# Patient Record
Sex: Female | Born: 1955
Health system: Southern US, Community
[De-identification: ages and names within clinical notes are randomized; demographics above are authoritative.]

## PROBLEM LIST (undated history)

## (undated) DIAGNOSIS — E669 Obesity, unspecified: Secondary | ICD-10-CM

## (undated) DIAGNOSIS — I350 Nonrheumatic aortic (valve) stenosis: Secondary | ICD-10-CM

## (undated) DIAGNOSIS — I10 Essential (primary) hypertension: Secondary | ICD-10-CM

## (undated) DIAGNOSIS — E785 Hyperlipidemia, unspecified: Secondary | ICD-10-CM

## (undated) DIAGNOSIS — M1990 Unspecified osteoarthritis, unspecified site: Secondary | ICD-10-CM

## (undated) DIAGNOSIS — J069 Acute upper respiratory infection, unspecified: Secondary | ICD-10-CM

## (undated) DIAGNOSIS — M519 Unspecified thoracic, thoracolumbar and lumbosacral intervertebral disc disorder: Secondary | ICD-10-CM

## (undated) DIAGNOSIS — I480 Paroxysmal atrial fibrillation: Secondary | ICD-10-CM

## (undated) DIAGNOSIS — E215 Disorder of parathyroid gland, unspecified: Secondary | ICD-10-CM

## (undated) HISTORY — DX: Obesity, unspecified: E66.9

## (undated) HISTORY — PX: KNEE SURGERY: SHX244

## (undated) HISTORY — DX: Essential (primary) hypertension: I10

## (undated) HISTORY — PX: CARPAL TUNNEL RELEASE: SHX101

## (undated) HISTORY — DX: Hyperlipidemia, unspecified: E78.5

## (undated) HISTORY — DX: Unspecified osteoarthritis, unspecified site: M19.90

## (undated) HISTORY — DX: Nonrheumatic aortic (valve) stenosis: I35.0

## (undated) HISTORY — DX: Paroxysmal atrial fibrillation: I48.0

## (undated) HISTORY — DX: Unspecified thoracic, thoracolumbar and lumbosacral intervertebral disc disorder: M51.9

## (undated) HISTORY — PX: ABDOMINAL HYSTERECTOMY: SHX81

---

## 1987-11-01 HISTORY — PX: ABDOMINAL HYSTERECTOMY: SHX81

## 1998-04-14 ENCOUNTER — Emergency Department (HOSPITAL_COMMUNITY): Admission: EM | Admit: 1998-04-14 | Discharge: 1998-04-14 | Payer: Self-pay | Admitting: Emergency Medicine

## 2001-02-19 ENCOUNTER — Encounter: Payer: Self-pay | Admitting: Emergency Medicine

## 2001-02-19 ENCOUNTER — Emergency Department (HOSPITAL_COMMUNITY): Admission: EM | Admit: 2001-02-19 | Discharge: 2001-02-19 | Payer: Self-pay | Admitting: Emergency Medicine

## 2001-05-02 ENCOUNTER — Encounter: Payer: Self-pay | Admitting: Family Medicine

## 2001-05-02 ENCOUNTER — Ambulatory Visit (HOSPITAL_COMMUNITY): Admission: RE | Admit: 2001-05-02 | Discharge: 2001-05-02 | Payer: Self-pay | Admitting: Family Medicine

## 2001-12-09 ENCOUNTER — Emergency Department (HOSPITAL_COMMUNITY): Admission: EM | Admit: 2001-12-09 | Discharge: 2001-12-09 | Payer: Self-pay | Admitting: *Deleted

## 2003-03-19 ENCOUNTER — Ambulatory Visit (HOSPITAL_COMMUNITY): Admission: RE | Admit: 2003-03-19 | Discharge: 2003-03-19 | Payer: Self-pay | Admitting: Orthopaedic Surgery

## 2003-03-19 ENCOUNTER — Encounter: Payer: Self-pay | Admitting: Orthopaedic Surgery

## 2003-11-01 HISTORY — PX: CARPAL TUNNEL RELEASE: SHX101

## 2006-04-12 ENCOUNTER — Ambulatory Visit (HOSPITAL_BASED_OUTPATIENT_CLINIC_OR_DEPARTMENT_OTHER): Admission: RE | Admit: 2006-04-12 | Discharge: 2006-04-12 | Payer: Self-pay | Admitting: *Deleted

## 2006-04-26 ENCOUNTER — Ambulatory Visit (HOSPITAL_BASED_OUTPATIENT_CLINIC_OR_DEPARTMENT_OTHER): Admission: RE | Admit: 2006-04-26 | Discharge: 2006-04-26 | Payer: Self-pay | Admitting: *Deleted

## 2006-10-31 HISTORY — PX: KNEE SURGERY: SHX244

## 2009-04-27 ENCOUNTER — Ambulatory Visit: Payer: Self-pay | Admitting: Orthopedic Surgery

## 2009-04-27 DIAGNOSIS — IMO0002 Reserved for concepts with insufficient information to code with codable children: Secondary | ICD-10-CM | POA: Insufficient documentation

## 2009-04-27 DIAGNOSIS — M1710 Unilateral primary osteoarthritis, unspecified knee: Secondary | ICD-10-CM

## 2009-04-30 ENCOUNTER — Encounter (INDEPENDENT_AMBULATORY_CARE_PROVIDER_SITE_OTHER): Payer: Self-pay | Admitting: *Deleted

## 2009-05-05 ENCOUNTER — Telehealth (INDEPENDENT_AMBULATORY_CARE_PROVIDER_SITE_OTHER): Payer: Self-pay | Admitting: *Deleted

## 2009-10-14 ENCOUNTER — Encounter: Payer: Self-pay | Admitting: Cardiology

## 2009-11-20 ENCOUNTER — Encounter: Payer: Self-pay | Admitting: Cardiology

## 2009-11-30 ENCOUNTER — Ambulatory Visit: Payer: Self-pay | Admitting: Cardiology

## 2009-11-30 DIAGNOSIS — E785 Hyperlipidemia, unspecified: Secondary | ICD-10-CM | POA: Insufficient documentation

## 2009-11-30 DIAGNOSIS — R0609 Other forms of dyspnea: Secondary | ICD-10-CM | POA: Insufficient documentation

## 2009-11-30 DIAGNOSIS — I10 Essential (primary) hypertension: Secondary | ICD-10-CM | POA: Insufficient documentation

## 2009-11-30 DIAGNOSIS — R0989 Other specified symptoms and signs involving the circulatory and respiratory systems: Secondary | ICD-10-CM

## 2009-12-01 ENCOUNTER — Encounter: Payer: Self-pay | Admitting: Cardiology

## 2009-12-01 ENCOUNTER — Ambulatory Visit: Payer: Self-pay | Admitting: Internal Medicine

## 2009-12-01 ENCOUNTER — Ambulatory Visit (HOSPITAL_COMMUNITY): Admission: RE | Admit: 2009-12-01 | Discharge: 2009-12-01 | Payer: Self-pay | Admitting: Cardiology

## 2009-12-01 ENCOUNTER — Ambulatory Visit: Payer: Self-pay

## 2009-12-02 ENCOUNTER — Telehealth: Payer: Self-pay | Admitting: Cardiology

## 2009-12-08 ENCOUNTER — Inpatient Hospital Stay (HOSPITAL_COMMUNITY): Admission: RE | Admit: 2009-12-08 | Discharge: 2009-12-10 | Payer: Self-pay | Admitting: Orthopedic Surgery

## 2010-02-23 ENCOUNTER — Inpatient Hospital Stay (HOSPITAL_COMMUNITY): Admission: AD | Admit: 2010-02-23 | Discharge: 2010-02-24 | Payer: Self-pay | Admitting: Orthopedic Surgery

## 2010-09-11 ENCOUNTER — Emergency Department (HOSPITAL_COMMUNITY): Admission: EM | Admit: 2010-09-11 | Discharge: 2010-09-11 | Payer: Self-pay | Admitting: Emergency Medicine

## 2010-11-30 NOTE — Progress Notes (Signed)
Summary: B/P follow-up  Phone Note Outgoing Call   Call placed by: Katina Dung, RN, BSN,  December 02, 2009 12:24 PM Call placed to: Patient Summary of Call: follow-up B/P  Follow-up for Phone Call        I called pt about her B/P --she had not  rechecked it since she saw Dr Shirlee Latch 11-30-09--she states she is seeing Dr Charlann Boxer today and she will call me back and let me know what her B/P is this afternoon

## 2010-11-30 NOTE — Assessment & Plan Note (Signed)
Summary: np6/surgical clearance/total knee   Visit Type:  Initial Consult Primary Provider:  Mirna Mires, MD  CC:  Surgical clearance- Total left knee replacement.  History of Present Illness: 55 yo with history of Garner, Melissa Garner, Melissa osteoarthritis presents for cardiac evaluation prior to left total knee replacement.  Patient has no significant cardiac history.  She has never had chest pain.  Ambulation is limited by knee pain.  She can walk about 1/4 mile before becoming short of breath.  She is able to climb a flight of steps but is short of breath by the time she gets to the top.  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  No chest pain.  She is noted to be mildly bradycardic (HR 48 on ECG) but denies lightheadedness or syncope.  Her knee surgery is scheduled for next Tuesday.  Labs (12/10): K 3.9, creatinine 0.64, LDL 138, HDL 40  ECG: sinus bradycardia at 48, 1st degree AVB (PR 220 msec).   Current Medications (verified): 1)  Amlodipine Besylate 10 Mg Tabs (Amlodipine Besylate) .... One Tablet Daily 2)  Lisinopril-Hydrochlorothiazide 20-12.5 Mg Tabs (Lisinopril-Hydrochlorothiazide) .... Take 1 Tablet By Mouth Once A Day 3)  Ibuprofen 200 Mg Tabs (Ibuprofen) .... As Needed 4)  Tylenol Extra Strength 500 Mg Tabs (Acetaminophen) .... As Needed 5)  Tramadol-Acetaminophen 37.5-325 Mg Tabs (Tramadol-Acetaminophen) .... As Needed 6)  Pravachol 40 Mg Tabs (Pravastatin Sodium) .... One Tablet Daily in The Evening  Allergies (verified): No Known Drug Allergies  Past History:  Past Medical History: 1. HTN 2. Garner 3. Hyperlipema 4. Osteoarthritis 5. Asthma  Family History: Family History of Arthritis Hx, family, asthma Father with MI at 40 Mother with MI at 75  Social History: Patient is divorced, lives in [place].  CNA Nonsmoker No ETOH  Review of Systems       All systems reviewed Melissa negative except as per HPI.    Vital Signs:  Patient profile:   55 year old female Height:      65 inches Weight:      272.50 pounds BMI:     45.51 Pulse rate:   56 / minute Pulse rhythm:   irregular Resp:     18 per minute BP sitting:   155 / 87  (left arm) Cuff size:   large  Vitals Entered By: Vikki Ports (November 30, 2009 3:56 PM)  Physical Exam  General:  Well developed, well nourished, in no acute distress. Obese.  Head:  normocephalic Melissa atraumatic Nose:  no deformity, discharge, inflammation, or lesions Mouth:  Teeth, gums Melissa palate normal. Oral mucosa normal. Neck:  Neck supple, no JVD. No masses, thyromegaly or abnormal cervical nodes. Lungs:  Clear bilaterally to auscultation Melissa percussion. Heart:  Non-displaced PMI, chest non-tender; regular rate Melissa rhythm, S1, S2 without murmurs, rubs or gallops. Carotid upstroke normal, no bruit.  Pedals normal pulses. No edema, no varicosities. Abdomen:  Bowel sounds positive; abdomen soft Melissa non-tender without masses, organomegaly, or hernias noted. No hepatosplenomegaly. Extremities:  No clubbing or cyanosis. Neurologic:  Alert Melissa oriented x 3. Skin:  Intact without lesions or rashes. Psych:  Normal affect.   Impression & Recommendations:  Problem # 1:  PREOPERATIVE CARDIAC EVALUATION No history of chest pain.  She does have some exertional shortness of breath which could certainly be due to Garner Melissa deconditioning.  She is able to exert herself to > 4 METS.  I think that she  is in a reasonable risk category to undergo surgery.  Stress testing would be unlikelly to be useful in this situation.   Problem # 2:  DYSPNEA ON EXERTION (ICD-786.09) This is likely due to Garner Melissa deconditioning.  I will have her get an echocardiogram to make sure that LV systolic function is normal.   Problem # 3:  HYPERTENSION, UNSPECIFIED (ICD-401.9) SBP is 155 here Melissa has been running in the 150s to 160s when she has checked it recently.  I will have her  increase her amlodipine to 10 mg daily. Followup BP check in 2 weeks.   Problem # 4:  Melissa Garner-MIXED (ICD-272.4) Given strong family history of CAD (mother at age 55), will start pravastatin 40 mg daily (LDL 138).  Lipids/LFTs in 2 months.    Other Orders: Echocardiogram (Echo)  Patient Instructions: 1)  Your physician has recommended you make the following change in your medication:  2)  Increase Amlodipine to 10mg  daily 3)  Start Pravachol 40mg  one daily in the evening 4)  AFTER  your surgery on Tuesday start Aspirin 81mg  daily 5)  Check your blood pressure--I will call you in a couple of days to get the readings 6)  Your physician has requested that you have an echocardiogram.  Echocardiography is a painless test that uses sound waves to create images of your heart. It provides your doctor with information about the size Melissa shape of your heart Melissa how well your heart's chambers Melissa valves are working.  This procedure takes approximately one hour. There are no restrictions for this procedure. THIS WEEK FOR SURGICAL CLEARANCE 7)  Your physician recommends that you return for a FASTING lipid profile/liver profile in 2 months   Prescriptions: PRAVACHOL 40 MG TABS (PRAVASTATIN SODIUM) one tablet daily in the evening  #30 x 3   Entered by:   Katina Dung, RN, BSN   Authorized by:   Marca Ancona, MD   Signed by:   Katina Dung, RN, BSN on 11/30/2009   Method used:   Electronically to        [facility]. [phone]* (retail)       [address] [place], [place]  [address]       Ph: [phone]       Fax: [phone]   RxID:   [id number] AMLODIPINE BESYLATE 10 MG TABS (AMLODIPINE BESYLATE) one tablet daily  #30 x 6   Entered by:   Katina Dung, RN, BSN   Authorized by:   Marca Ancona, MD   Signed by:   Katina Dung, RN, BSN on 11/30/2009   Method used:   Electronically to        [facility]. [phone]* (retail)       [address]  [place]  [address]       Ph: [phone]       Fax: [phone]   RxID:   [id number]

## 2010-11-30 NOTE — Consult Note (Signed)
Summary: [facility]  [facility]   Imported By: Marylou Mccoy 12/03/2009 13:53:53  _____________________________________________________________________  External Attachment:    Type:   Image     Comment:   External Document

## 2011-01-11 LAB — URINALYSIS, ROUTINE W REFLEX MICROSCOPIC
Glucose, UA: NEGATIVE mg/dL
Hgb urine dipstick: NEGATIVE
Leukocytes, UA: NEGATIVE
Nitrite: NEGATIVE
Protein, ur: 30 mg/dL — AB
Specific Gravity, Urine: >1.03 — ABNORMAL HIGH (ref 1.005–1.030)
Urobilinogen, UA: 2 mg/dL — ABNORMAL HIGH (ref 0.0–1.0)
pH: 6 (ref 5.0–8.0)

## 2011-01-11 LAB — URINE CULTURE

## 2011-01-11 LAB — URINE MICROSCOPIC-ADD ON

## 2011-01-18 LAB — BASIC METABOLIC PANEL
Calcium: 10.5 mg/dL (ref 8.4–10.5)
GFR calc Af Amer: >60 mL/min (ref >60)
GFR calc non Af Amer: >60 mL/min (ref >60)
Potassium: 3.8 mEq/L (ref 3.5–5.1)
Sodium: 142 mEq/L (ref 135–145)

## 2011-01-20 LAB — DIFFERENTIAL
Eosinophils Relative: 2 % (ref 0–5)
Lymphocytes Relative: 37 % (ref 12–46)
Lymphs Abs: 1.5 10*3/uL (ref 0.7–4.0)
Monocytes Relative: 9 % (ref 3–12)

## 2011-01-20 LAB — URINALYSIS, ROUTINE W REFLEX MICROSCOPIC
Glucose, UA: NEGATIVE mg/dL
Protein, ur: NEGATIVE mg/dL
Specific Gravity, Urine: 1.022 (ref 1.005–1.030)
Urobilinogen, UA: 0.2 mg/dL (ref 0.0–1.0)

## 2011-01-20 LAB — CBC
HCT: 37.9 % (ref 36.0–46.0)
Hemoglobin: 12.6 g/dL (ref 12.0–15.0)
MCHC: 33.2 g/dL (ref 30.0–36.0)
MCHC: 33.9 g/dL (ref 30.0–36.0)
MCV: 82.7 fL (ref 78.0–100.0)
Platelets: 152 10*3/uL (ref 150–400)
Platelets: 173 10*3/uL (ref 150–400)
RBC: 3.21 MIL/uL — ABNORMAL LOW (ref 3.87–5.11)
RBC: 4.59 MIL/uL (ref 3.87–5.11)
WBC: 4 10*3/uL (ref 4.0–10.5)
WBC: 6.2 10*3/uL (ref 4.0–10.5)

## 2011-01-20 LAB — BASIC METABOLIC PANEL
BUN: 12 mg/dL (ref 6–23)
BUN: 14 mg/dL (ref 6–23)
CO2: 27 mEq/L (ref 19–32)
Calcium: 9.2 mg/dL (ref 8.4–10.5)
Chloride: 102 mEq/L (ref 96–112)
Chloride: 105 mEq/L (ref 96–112)
Creatinine, Ser: 0.53 mg/dL (ref 0.4–1.2)
Creatinine, Ser: 0.66 mg/dL (ref 0.4–1.2)
GFR calc Af Amer: >60 mL/min (ref >60)
GFR calc Af Amer: >60 mL/min (ref >60)
GFR calc non Af Amer: >60 mL/min (ref >60)
Potassium: 3.6 mEq/L (ref 3.5–5.1)
Sodium: 138 mEq/L (ref 135–145)

## 2011-01-20 LAB — TYPE AND SCREEN: ABO/RH(D): O NEG

## 2011-01-20 LAB — ABO/RH: ABO/RH(D): O NEG

## 2011-01-20 LAB — PROTIME-INR: Prothrombin Time: 13.3 seconds (ref 11.6–15.2)

## 2011-03-18 NOTE — Op Note (Signed)
NAME:  Melissa Garner, Melissa Garner           ACCOUNT NO.:  [id number]   MEDICAL RECORD NO.:  [id number]          PATIENT TYPE:  AMB   LOCATION:  DSC                          FACILITY:  [facility]   PHYSICIAN:  Tennis Must Meyerdierks, M.D.DATE OF BIRTH:  1956/07/26   DATE OF PROCEDURE:  04/12/2006  DATE OF DISCHARGE:                                 OPERATIVE REPORT   PREOPERATIVE DIAGNOSIS:  Left carpal tunnel syndrome.   POSTOP DIAGNOSIS:  Left carpal tunnel syndrome.   PROCEDURE:  Decompression median nerve left carpal tunnel.   SURGEON:  Lowell Bouton, M.D.   ANESTHESIA:  Half percent Marcaine local with sedation.   OPERATIVE FINDINGS:  The patient had no masses in the carpal canal.  The  motor branch of the nerve was intact.   PROCEDURE:  Under half percent Marcaine local anesthesia with a tourniquet  on the left arm.  The left hand was prepped and draped in usual fashion and  after exsanguinating the limb, tourniquet was inflated to 250 mmHg.  3 cm  longitudinal incision was made in the palm just ulnar to the thenar crease.  Sharp dissection was carried through the subcutaneous tissues.  Blunt  dissection was carried through the superficial fascia distal to the  transverse carpal ligament.  A hemostat was then placed in the carpal canal  up against the hook of the hamate and the transverse carpal ligament was  divided on the ulnar border of median nerve.  The proximal end of ligament  was divided with the scissors after dissecting the nerve away from the  undersurface of the ligament.  The nerve was then examined and the motor  branch was identified.  The carpal canal was palpated and was found to be  adequately decompressed.  The wound was irrigated with saline.  The skin was  closed with 4-0 nylon sutures.  Sterile dressings were applied followed by a  volar wrist splint.  The patient tolerated the procedure well and went to  recovery room awake, stable and in good  condition.      Lowell Bouton, M.D.  Electronically Signed     EMM/MEDQ  D:  04/12/2006  T:  04/12/2006  Job:  [id number]

## 2011-03-18 NOTE — Op Note (Signed)
NAME:  Melissa Garner, Melissa Garner           ACCOUNT NO.:  [id number]   MEDICAL RECORD NO.:  [id number]          PATIENT TYPE:  AMB   LOCATION:  DSC                          FACILITY:  [facility]   PHYSICIAN:  Tennis Must Meyerdierks, M.D.DATE OF BIRTH:  [birth year]   DATE OF PROCEDURE:  04/26/2006  DATE OF DISCHARGE:                                 OPERATIVE REPORT   PREOPERATIVE DIAGNOSIS:  Right carpal tunnel syndrome.   POSTOPERATIVE DIAGNOSIS:  Right carpal tunnel syndrome.   PROCEDURE:  Decompression of median nerve right carpal tunnel.   SURGEON:  Lowell Bouton, M.D.   ANESTHESIA:  0.5% Marcaine local with sedation.   OPERATIVE FINDINGS:  The patient had no masses in the carpal canal.  The  motor branch of the nerve was intact.   PROCEDURE:  Under 0.5% Marcaine local anesthesia with a tourniquet on the  right arm, the right hand was prepped and draped in the usual fashion. After  exsanguinating the limb, the tourniquet was inflated to 250 mmHg.  A 3 cm  longitudinal incision was made in the palm just ulnar to the thenar crease.  Sharp dissection was carried through the subcutaneous tissues.  Blunt  dissection was carried through the superficial palmar fascia distal to the  transverse carpal ligament.  A hemostat was then placed in the carpal canal  up against the hook of the hamate and the transverse carpal ligament was  divided on the ulnar border of the median nerve sharply.  The proximal end  of the ligament was divided with the scissors after dissecting the nerve  away from the under surface of the ligament.  The carpal canal was then  palpated and was found to be adequately decompressed. The nerve was examined  and the motor branch was identified.  The wound was then irrigated with  saline.  The skin was closed with 4-0 nylon sutures.  Sterile dressings were  applied followed by a volar wrist splint.  The patient tolerated the  procedure well and went to the recovery  room awake in stable, good  condition.      Lowell Bouton, M.D.  Electronically Signed     EMM/MEDQ  D:  04/26/2006  T:  04/26/2006  Job:  [id number]

## 2013-01-12 ENCOUNTER — Encounter (HOSPITAL_COMMUNITY): Payer: Self-pay

## 2013-01-12 ENCOUNTER — Emergency Department (HOSPITAL_COMMUNITY)
Admission: EM | Admit: 2013-01-12 | Discharge: 2013-01-12 | Disposition: A | Discharge status: Home / Self Care | Payer: BC Managed Care – PPO | Attending: Emergency Medicine | Admitting: Emergency Medicine

## 2013-01-12 ENCOUNTER — Emergency Department (HOSPITAL_COMMUNITY): Payer: BC Managed Care – PPO

## 2013-01-12 DIAGNOSIS — I10 Essential (primary) hypertension: Secondary | ICD-10-CM | POA: Insufficient documentation

## 2013-01-12 DIAGNOSIS — R109 Unspecified abdominal pain: Secondary | ICD-10-CM

## 2013-01-12 DIAGNOSIS — N2889 Other specified disorders of kidney and ureter: Secondary | ICD-10-CM

## 2013-01-12 DIAGNOSIS — Z90710 Acquired absence of both cervix and uterus: Secondary | ICD-10-CM | POA: Insufficient documentation

## 2013-01-12 LAB — CBC WITH DIFFERENTIAL/PLATELET
Eosinophils Relative: 2 % (ref 0–5)
HCT: 38.1 % (ref 36.0–46.0)
Lymphocytes Relative: 35 % (ref 12–46)
Lymphs Abs: 1.4 10*3/uL (ref 0.7–4.0)
MCV: 81.4 fL (ref 78.0–100.0)
Platelets: 224 10*3/uL (ref 150–400)
RBC: 4.68 MIL/uL (ref 3.87–5.11)
WBC: 4.2 10*3/uL (ref 4.0–10.5)

## 2013-01-12 LAB — URINALYSIS, ROUTINE W REFLEX MICROSCOPIC
Ketones, ur: NEGATIVE mg/dL
Nitrite: NEGATIVE
Protein, ur: NEGATIVE mg/dL
Specific Gravity, Urine: >1.03 — ABNORMAL HIGH (ref 1.005–1.030)
Urobilinogen, UA: 0.2 mg/dL (ref 0.0–1.0)

## 2013-01-12 LAB — COMPREHENSIVE METABOLIC PANEL
ALT: 10 U/L (ref 0–35)
Alkaline Phosphatase: 103 U/L (ref 39–117)
CO2: 28 mEq/L (ref 19–32)
Calcium: 10.7 mg/dL — ABNORMAL HIGH (ref 8.4–10.5)
Chloride: 104 mEq/L (ref 96–112)
GFR calc Af Amer: >90 mL/min (ref >90)
GFR calc non Af Amer: >90 mL/min (ref >90)
Glucose, Bld: 97 mg/dL (ref 70–99)
Sodium: 142 mEq/L (ref 135–145)
Total Bilirubin: 0.4 mg/dL (ref 0.3–1.2)

## 2013-01-12 MED ORDER — HYDROCODONE-ACETAMINOPHEN 5-325 MG PO TABS: 2.0000 | ORAL_TABLET | ORAL | Status: DC | PRN

## 2013-01-12 NOTE — ED Provider Notes (Signed)
History     CSN: [id number]  Arrival date & time 01/12/13  1310   First MD Initiated Contact with Patient 01/12/13 1338      Chief Complaint  Patient presents with  . Flank Pain    (Consider location/radiation/quality/duration/timing/severity/associated sxs/prior treatment) HPI Comments: Patient presents with pain in the left flank for the past two days.  She denies any fevers or chills.  No urinary complaints.  No history of kidney stones.    Patient is a 57 y.o. female presenting with flank pain. The history is provided by the patient.  Flank Pain This is a new problem. The current episode started 2 days ago. The problem occurs constantly. The problem has been gradually worsening. Pertinent negatives include no abdominal pain. Nothing aggravates the symptoms. Nothing relieves the symptoms. She has tried nothing for the symptoms.    Past Medical History  Diagnosis Date  . Hypertension     Past Surgical History  Procedure Laterality Date  . Abdominal hysterectomy      partial  . Knee surgery      No family history on file.  History  Substance Use Topics  . Smoking status: Never Smoker   . Smokeless tobacco: Not on file  . Alcohol Use: No    OB History   Grav Para Term Preterm Abortions TAB SAB Ect Mult Living                  Review of Systems  Gastrointestinal: Negative for abdominal pain.  Genitourinary: Positive for flank pain.  All other systems reviewed and are negative.    Allergies  Review of patient's allergies indicates no known allergies.  Home Medications  No current outpatient prescriptions on file.  BP 160/92  Pulse 60  Temp(Src) 98 F (36.7 C) (Oral)  Resp 18  Ht 5\' 6"  (1.676 m)  Wt 286 lb (129.729 kg)  BMI 46.18 kg/m2  SpO2 98%  Physical Exam  Nursing note and vitals reviewed. Constitutional: She is oriented to person, place, and time. She appears well-developed and well-nourished. No distress.  HENT:  Head: Normocephalic and  atraumatic.  Mouth/Throat: Oropharynx is clear and moist.  Neck: Normal range of motion. Neck supple.  Cardiovascular: Normal rate and regular rhythm.   No murmur heard. Pulmonary/Chest: Effort normal and breath sounds normal. No respiratory distress. She has no wheezes.  Abdominal: Soft. Bowel sounds are normal. She exhibits no distension. There is no tenderness.  There is mild cva ttp.    Musculoskeletal: Normal range of motion. She exhibits no edema.  Lymphadenopathy:    She has no cervical adenopathy.  Neurological: She is alert and oriented to person, place, and time.  Skin: Skin is warm and dry. She is not diaphoretic.    ED Course  Procedures (including critical care time)  Labs Reviewed  COMPREHENSIVE METABOLIC PANEL - Abnormal; Notable for the following:    Calcium 10.7 (*)    All other components within normal limits  URINALYSIS, ROUTINE W REFLEX MICROSCOPIC - Abnormal; Notable for the following:    Specific Gravity, Urine >1.030 (*)    Bilirubin Urine SMALL (*)    All other components within normal limits  CBC WITH DIFFERENTIAL   Ct Abdomen Pelvis Wo Contrast  01/12/2013  *RADIOLOGY REPORT*  Clinical Data: 57 year old female with abdominal, pelvic and flank pain.  CT ABDOMEN AND PELVIS WITHOUT CONTRAST  Technique:  Multidetector CT imaging of the abdomen and pelvis was performed following the standard protocol without intravenous  contrast.  Comparison: Report from 08/10/2004 pelvic ultrasound.  Findings: The lung bases are clear.  The liver, spleen, pancreas, right kidney and gallbladder are unremarkable.  An 8 mm focal bulge of the posterior mid left kidney (image 30) is noted and a small focal exophytic mass is not excluded.  Please note that parenchymal abnormalities may be missed as intravenous contrast was not administered.  No free fluid, enlarged lymph nodes, biliary dilation or abdominal aortic aneurysm identified.  The bowel, bladder and appendix are unremarkable. The  patient is status post hysterectomy.  A moderate to large right paramedian low pelvic ventral hernia containing fat is identified. A 1.3 x 1.5 cm density in the left adnexal region is noted, but has a similar size to the previously reported probable left ovary on the 08/10/2004 ultrasound.  No acute or suspicious bony abnormalities are identified.  IMPRESSION: No evidence of acute abnormality.  Possible 8 mm left renal mass.  Recommend CT or MRI with and without contrast for further evaluation.  Moderate to large right paramedian low pelvic ventral hernia containing fat.   Original Report Authenticated By: Harmon Pier, M.D.      No diagnosis found.    MDM  The labs and ua are essentially unremarkable.  The ct does not reveal a kidney stone, but there is the suggestion of a possible renal mass.  The suggestion is for a ct with contrast or mri.  I will make arrangements for an outpatient mri, results to be followed up by Dr. Minerva Areola.         Geoffery Lyons, MD 01/12/13 [phone]

## 2013-01-12 NOTE — ED Notes (Signed)
Pt reports pain that starts in her left side that radiates to her back. Denies N/V/D and denies painful urination.

## 2013-01-15 ENCOUNTER — Ambulatory Visit (HOSPITAL_COMMUNITY)
Admit: 2013-01-15 | Discharge: 2013-01-15 | Disposition: A | Discharge status: Home / Self Care | Payer: BC Managed Care – PPO | Source: Ambulatory Visit | Attending: Emergency Medicine | Admitting: Emergency Medicine

## 2013-01-15 ENCOUNTER — Encounter (HOSPITAL_COMMUNITY): Payer: Self-pay

## 2013-01-15 DIAGNOSIS — N2889 Other specified disorders of kidney and ureter: Secondary | ICD-10-CM

## 2013-01-15 DIAGNOSIS — R9389 Abnormal findings on diagnostic imaging of other specified body structures: Secondary | ICD-10-CM | POA: Insufficient documentation

## 2013-01-15 DIAGNOSIS — R1032 Left lower quadrant pain: Secondary | ICD-10-CM | POA: Insufficient documentation

## 2013-01-15 MED ORDER — GADOBENATE DIMEGLUMINE 529 MG/ML IV SOLN
20.0000 mL | Freq: Once | INTRAVENOUS | Status: AC | PRN
  Administered 2013-01-15: 20 mL via INTRAVENOUS

## 2013-07-11 ENCOUNTER — Emergency Department (HOSPITAL_COMMUNITY)
Admission: EM | Admit: 2013-07-11 | Discharge: 2013-07-12 | Disposition: A | Discharge status: Home / Self Care | Payer: BC Managed Care – PPO | Attending: Emergency Medicine | Admitting: Emergency Medicine

## 2013-07-11 ENCOUNTER — Encounter (HOSPITAL_COMMUNITY): Payer: Self-pay | Admitting: Emergency Medicine

## 2013-07-11 DIAGNOSIS — R001 Bradycardia, unspecified: Secondary | ICD-10-CM

## 2013-07-11 DIAGNOSIS — Z79899 Other long term (current) drug therapy: Secondary | ICD-10-CM | POA: Insufficient documentation

## 2013-07-11 DIAGNOSIS — R110 Nausea: Secondary | ICD-10-CM

## 2013-07-11 DIAGNOSIS — I498 Other specified cardiac arrhythmias: Secondary | ICD-10-CM | POA: Insufficient documentation

## 2013-07-11 DIAGNOSIS — I10 Essential (primary) hypertension: Secondary | ICD-10-CM | POA: Insufficient documentation

## 2013-07-11 MED ORDER — SODIUM CHLORIDE 0.9 % IV BOLUS (SEPSIS)
1000.0000 mL | Freq: Once | INTRAVENOUS | Status: AC
  Administered 2013-07-11: 1000 mL via INTRAVENOUS

## 2013-07-11 MED ORDER — ONDANSETRON HCL 4 MG/2ML IJ SOLN
4.0000 mg | Freq: Once | INTRAMUSCULAR | Status: AC
  Administered 2013-07-11: 4 mg via INTRAVENOUS
  Filled 2013-07-11: qty 2

## 2013-07-11 NOTE — ED Provider Notes (Signed)
CSN: [id number]     Arrival date & time 07/11/13  2247 History   This chart was scribed for Lyanne Co, MD by Clydene Laming, ED Scribe. This patient was seen in room APA18/APA18 and the patient's care was started at 11:25 PM. Chief Complaint  Patient presents with  . Dizziness  . Nausea    The history is provided by the patient. No language interpreter was used.    HPI Comments: Melissa Garner is a 57 y.o. female who presents to the Emergency Department complaining of dizziness that began a couple of hours ago that is worsening with associated nausea and a feeling of being light headed . She denies having symptoms at rest. Pt has prior episodes of bradycardia with a rate normally in the forties and fifty's. She has f/u [facility] and was told she has mild CHF. Pt denies any recent illness,chest pain, SOB, frequency, abdominal pain, diarrhea, emesis, fever, chills, leg swelling, and hematochezia. Pt denies any prior thyroid complications. Pt takes hydrochlorothiazide-lisinopril, amlodipine, and acetaminophen. Pt denies any change in medications.Pcp is Dr. Loleta Chance.    Past Medical History  Diagnosis Date  . Hypertension    Past Surgical History  Procedure Laterality Date  . Abdominal hysterectomy      partial  . Knee surgery     No family history on file. History  Substance Use Topics  . Smoking status: Never Smoker   . Smokeless tobacco: Not on file  . Alcohol Use: No   OB History   Grav Para Term Preterm Abortions TAB SAB Ect Mult Living                 Review of Systems  A complete 10 system review of systems was obtained and all systems are negative except as noted in the HPI and PMH.   Allergies  Review of patient's allergies indicates no known allergies.  Home Medications   Current Outpatient Rx  Name  Route  Sig  Dispense  Refill  . amLODipine (NORVASC) 10 MG tablet   Oral   Take 0.5 tablets (5 mg total) by mouth daily.   30 tablet   0   .  HYDROcodone-acetaminophen (NORCO) 5-325 MG per tablet   Oral   Take 2 tablets by mouth every 4 (four) hours as needed for pain.   20 tablet   0   . lisinopril-hydrochlorothiazide (PRINZIDE,ZESTORETIC) 20-12.5 MG per tablet   Oral   Take 1 tablet by mouth daily.   30 tablet   1   . ondansetron (ZOFRAN ODT) 8 MG disintegrating tablet   Oral   Take 1 tablet (8 mg total) by mouth every 8 (eight) hours as needed for nausea.   10 tablet   0    Triage Vitals: BP 146/81  Pulse 42  Temp(Src) 98.4 F (36.9 C) (Oral)  Resp 20  Ht 5\' 5"  (1.651 m)  Wt 286 lb (129.729 kg)  BMI 47.59 kg/m2  SpO2 100% Physical Exam  Nursing note and vitals reviewed. Constitutional: She is oriented to person, place, and time. She appears well-developed and well-nourished. No distress.  HENT:  Head: Normocephalic and atraumatic.  Eyes: EOM are normal.  Neck: Normal range of motion.  Cardiovascular: Regular rhythm and normal heart sounds.  Bradycardia present.   No murmur heard. Pulmonary/Chest: Effort normal and breath sounds normal.  Lungs normal  Abdominal: Soft. She exhibits no distension. There is no tenderness.  Musculoskeletal: Normal range of motion.  Neurological: She is  alert and oriented to person, place, and time.  Skin: Skin is warm and dry.  Psychiatric: She has a normal mood and affect. Judgment normal.      ED Course  Procedures (including critical care time) DIAGNOSTIC STUDIES: Oxygen Saturation is 100% on RA, normal by my interpretation.     Date: 07/11/2013  Rate: 40  Rhythm: sinus bradycardia  QRS Axis: normal  Intervals: PR prolonged  ST/T Wave abnormalities: normal  Conduction Disutrbances:first-degree A-V block   Narrative Interpretation:   Old EKG Reviewed: none available  COORDINATION OF CARE:  11:33 PM- Reviewed patient's prior records in room. Advised patient that her amlodipine may need to be changed. Discussed treatment plan with pt at bedside. Pt verbalized  understanding and agreement with plan.    Labs Review Labs Reviewed  BASIC METABOLIC PANEL - Abnormal; Notable for the following:    Glucose, Bld 112 (*)    All other components within normal limits  CBC  TROPONIN I  MAGNESIUM  TSH  T4, FREE   Imaging Review No results found.  MDM   1. Nausea   2. Bradycardia    Patient is overall well-appearing.  She's had no anginal symptoms.  Her EKG does demonstrate sinus bradycardia.  She's been bradycardic before in the past.  Her last cardiology visit her heart rate was 48 with first degree AV block.  She is on amlodipine which acts as a calcium channel blocker.  The patient's amlodipine will be reduced from 10 mg daily to 5 mg daily.  She was able to and around the emergency apartment pain difficulty.  She feels fine.  Discharge home with cardiology followup.  She has not had syncope.  A 1 point she did describe her symptoms as the room to be spinning around and therefore some of this could represent peripheral vertigo.  Home with Zofran.  Doubt central vertigo.  Discharge home in good condition.  I personally performed the services described in this documentation, which was scribed in my presence. The recorded information has been reviewed and is accurate.      Lyanne Co, MD 07/12/13 [phone]

## 2013-07-11 NOTE — ED Notes (Signed)
Patient states she has been dizzy and nauseated all day, but has been getting worse in the past few hours.

## 2013-07-11 NOTE — ED Notes (Signed)
MD at bedside. 

## 2013-07-12 LAB — CBC
MCV: 84.4 fL (ref 78.0–100.0)
Platelets: 206 10*3/uL (ref 150–400)
RBC: 4.54 MIL/uL (ref 3.87–5.11)
RDW: 13.6 % (ref 11.5–15.5)
WBC: 4.7 10*3/uL (ref 4.0–10.5)

## 2013-07-12 LAB — BASIC METABOLIC PANEL
Chloride: 104 mEq/L (ref 96–112)
Creatinine, Ser: 0.72 mg/dL (ref 0.50–1.10)
GFR calc Af Amer: >90 mL/min (ref >90)
Sodium: 139 mEq/L (ref 135–145)

## 2013-07-12 LAB — MAGNESIUM: Magnesium: 1.9 mg/dL (ref 1.5–2.5)

## 2013-07-12 LAB — TROPONIN I: Troponin I: <0.3 ng/mL (ref <0.30)

## 2013-07-12 LAB — T4, FREE: Free T4: 0.94 ng/dL (ref 0.80–1.80)

## 2013-07-12 MED ORDER — AMLODIPINE BESYLATE 10 MG PO TABS: 5.0000 mg | ORAL_TABLET | Freq: Every day | ORAL | Status: DC

## 2013-07-12 MED ORDER — ONDANSETRON 8 MG PO TBDP: 8.0000 mg | ORAL_TABLET | Freq: Three times a day (TID) | ORAL | Status: DC | PRN

## 2013-07-12 MED ORDER — LISINOPRIL-HYDROCHLOROTHIAZIDE 20-12.5 MG PO TABS: 1.0000 | ORAL_TABLET | Freq: Every day | ORAL | Status: DC

## 2015-01-14 ENCOUNTER — Other Ambulatory Visit (HOSPITAL_COMMUNITY): Payer: Self-pay | Admitting: Family Medicine

## 2015-01-14 ENCOUNTER — Ambulatory Visit (HOSPITAL_COMMUNITY)
Admission: RE | Admit: 2015-01-14 | Discharge: 2015-01-14 | Disposition: A | Discharge status: Home / Self Care | Payer: BLUE CROSS/BLUE SHIELD | Source: Ambulatory Visit | Attending: Family Medicine | Admitting: Family Medicine

## 2015-01-14 DIAGNOSIS — G8929 Other chronic pain: Secondary | ICD-10-CM

## 2015-01-14 DIAGNOSIS — M25511 Pain in right shoulder: Secondary | ICD-10-CM | POA: Diagnosis not present

## 2015-01-19 ENCOUNTER — Other Ambulatory Visit (HOSPITAL_COMMUNITY): Payer: Self-pay | Admitting: Family Medicine

## 2015-01-19 DIAGNOSIS — Z1231 Encounter for screening mammogram for malignant neoplasm of breast: Secondary | ICD-10-CM

## 2015-01-28 ENCOUNTER — Ambulatory Visit (HOSPITAL_COMMUNITY): Payer: BLUE CROSS/BLUE SHIELD

## 2015-02-02 ENCOUNTER — Ambulatory Visit (HOSPITAL_COMMUNITY)
Admission: RE | Admit: 2015-02-02 | Discharge: 2015-02-02 | Disposition: A | Discharge status: Home / Self Care | Payer: BLUE CROSS/BLUE SHIELD | Source: Ambulatory Visit | Attending: Family Medicine | Admitting: Family Medicine

## 2015-02-02 DIAGNOSIS — Z1231 Encounter for screening mammogram for malignant neoplasm of breast: Secondary | ICD-10-CM | POA: Insufficient documentation

## 2015-02-24 ENCOUNTER — Other Ambulatory Visit: Payer: Self-pay | Admitting: Family Medicine

## 2015-02-24 DIAGNOSIS — R928 Other abnormal and inconclusive findings on diagnostic imaging of breast: Secondary | ICD-10-CM

## 2015-03-02 ENCOUNTER — Other Ambulatory Visit: Payer: BLUE CROSS/BLUE SHIELD

## 2015-03-06 ENCOUNTER — Ambulatory Visit
Admission: RE | Admit: 2015-03-06 | Discharge: 2015-03-06 | Disposition: A | Discharge status: Home / Self Care | Payer: BLUE CROSS/BLUE SHIELD | Source: Ambulatory Visit | Attending: Family Medicine | Admitting: Family Medicine

## 2015-03-06 ENCOUNTER — Encounter (INDEPENDENT_AMBULATORY_CARE_PROVIDER_SITE_OTHER): Payer: Self-pay

## 2015-03-06 DIAGNOSIS — R928 Other abnormal and inconclusive findings on diagnostic imaging of breast: Secondary | ICD-10-CM

## 2015-12-10 ENCOUNTER — Other Ambulatory Visit (HOSPITAL_COMMUNITY): Payer: Self-pay | Admitting: Family Medicine

## 2015-12-10 DIAGNOSIS — N289 Disorder of kidney and ureter, unspecified: Secondary | ICD-10-CM

## 2015-12-17 ENCOUNTER — Ambulatory Visit: Payer: BLUE CROSS/BLUE SHIELD | Admitting: Orthopaedic Surgery

## 2015-12-17 ENCOUNTER — Telehealth: Payer: Self-pay

## 2015-12-17 NOTE — Telephone Encounter (Signed)
Pt states that she has constipation all of the time. OV with Laban Emperor, NP on 12/30/2015 at 8:30 AM.

## 2015-12-17 NOTE — Telephone Encounter (Signed)
Pt received a letter from DS. I told her DS does her triages in the afternoons and would be in touch with her. Please call [phone]

## 2015-12-21 ENCOUNTER — Other Ambulatory Visit (HOSPITAL_COMMUNITY): Payer: Self-pay | Admitting: Family Medicine

## 2015-12-21 DIAGNOSIS — M545 Low back pain: Secondary | ICD-10-CM

## 2015-12-21 DIAGNOSIS — N289 Disorder of kidney and ureter, unspecified: Secondary | ICD-10-CM

## 2015-12-23 ENCOUNTER — Ambulatory Visit (HOSPITAL_COMMUNITY)
Admission: RE | Admit: 2015-12-23 | Discharge: 2015-12-23 | Disposition: A | Discharge status: Home / Self Care | Payer: BLUE CROSS/BLUE SHIELD | Source: Ambulatory Visit | Attending: Family Medicine | Admitting: Family Medicine

## 2015-12-23 ENCOUNTER — Other Ambulatory Visit: Payer: Self-pay | Admitting: Family Medicine

## 2015-12-23 DIAGNOSIS — N289 Disorder of kidney and ureter, unspecified: Secondary | ICD-10-CM

## 2015-12-23 DIAGNOSIS — M545 Low back pain: Secondary | ICD-10-CM

## 2015-12-23 MED ORDER — SODIUM CHLORIDE 0.9 % IV SOLN
INTRAVENOUS | Status: AC
  Filled 2015-12-23: qty 250

## 2015-12-23 MED ORDER — SODIUM CHLORIDE 0.9% FLUSH
INTRAVENOUS | Status: AC
  Filled 2015-12-23: qty 100

## 2015-12-30 ENCOUNTER — Encounter: Payer: Self-pay | Admitting: Gastroenterology

## 2015-12-30 ENCOUNTER — Ambulatory Visit (INDEPENDENT_AMBULATORY_CARE_PROVIDER_SITE_OTHER): Payer: BLUE CROSS/BLUE SHIELD | Admitting: Gastroenterology

## 2015-12-30 ENCOUNTER — Other Ambulatory Visit: Payer: Self-pay

## 2015-12-30 VITALS — BP 159/79 | HR 68 | Temp 98.0°F | Ht 66.0 in | Wt 289.2 lb

## 2015-12-30 DIAGNOSIS — Z1211 Encounter for screening for malignant neoplasm of colon: Secondary | ICD-10-CM

## 2015-12-30 DIAGNOSIS — K5900 Constipation, unspecified: Secondary | ICD-10-CM | POA: Diagnosis not present

## 2015-12-30 MED ORDER — PEG 3350-KCL-NA BICARB-NACL 420 G PO SOLR: 4000.0000 mL | ORAL | Status: DC

## 2015-12-30 NOTE — Assessment & Plan Note (Signed)
Chronic constipation. Start Linzess 145 mcg once daily. Samples provided and voucher card. If she likes this, we will send in a 90 day supply so she may receive this at a reduced cost.

## 2015-12-30 NOTE — Progress Notes (Signed)
cc'ed to pcp °

## 2015-12-30 NOTE — Assessment & Plan Note (Signed)
60 year old female due for initial screening colonoscopy now, without any concerning signs or symptoms. No FH of colon cancer.  Proceed with colonoscopy with Dr. Oneida Alar in the near future. The risks, benefits, and alternatives have been discussed in detail with the patient. They state understanding and desire to proceed.

## 2015-12-30 NOTE — Patient Instructions (Signed)
We have scheduled you for a colonoscopy with Dr. Oneida Alar in the near future. This is for routine screening purposes.  For constipation: start taking Linzess 1 capsule each morning on an empty stomach, at least 30 minutes before breakfast. You may have some loose stool for a few days, but it should get better. If not, call me. I have provided a voucher for you to activate only if you like it and would like an actual prescription.

## 2015-12-30 NOTE — Progress Notes (Signed)
Primary Care Physician:  Maggie Font, MD Primary Gastroenterologist:  Dr. Oneida Alar   Chief Complaint  Patient presents with  . Constipation  . set up TCS    HPI:   Melissa Garner is a 60 y.o. female presenting today at the request of Dr. Berdine Addison to arrange a routine screening colonoscopy. She was brought in due to history of chronic constipation.   Chronic constipation for last several years. Every now and then will have to take Miralax or something of that nature. Associated abdominal cramping, bloating. Now having a BM once every 3-4 days. Bristol scale #1. No rectal bleeding. No unexplained weight loss or lack of appetite. No family history of colon cancer. No prior colonoscopy. 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 Nursing Assistant. Has MRI scheduled due to concern for renal cyst.   Past Medical History  Diagnosis Date  . Hypertension   . Joint pain     Past Surgical History  Procedure Laterality Date  . Abdominal hysterectomy      partial  . Knee surgery    . Carpal tunnel release      Current Outpatient Prescriptions  Medication Sig Dispense Refill  . amLODipine (NORVASC) 10 MG tablet Take 0.5 tablets (5 mg total) by mouth daily. 30 tablet 0  . furosemide (LASIX) 20 MG tablet Take 20 mg by mouth. 1 tab on Monday and friday    . ibuprofen (ADVIL,MOTRIN) 200 MG tablet Take 200 mg by mouth every 6 (six) hours as needed.    Marland Kitchen lisinopril-hydrochlorothiazide (PRINZIDE,ZESTORETIC) 20-12.5 MG per tablet Take 1 tablet by mouth daily. 30 tablet 1  . traMADol (ULTRAM) 50 MG tablet   0   No current facility-administered medications for this visit.    Allergies as of 12/30/2015  . (No Known Allergies)    Family History  Problem Relation Age of Onset  . Colon cancer Neg Hx     Social History   Social History  . Marital Status: Single    Spouse Name: N/A  . Number of Children: N/A  . Years of Education: N/A   Occupational History   . nursing assistant    Social History Main Topics  . Smoking status: Never Smoker   . Smokeless tobacco: Not on file  . Alcohol Use: No  . Drug Use: No  . Sexual Activity: Not on file   Other Topics Concern  . Not on file   Social History Narrative    Review of Systems: Gen: see HPI  CV: occasional irregular heart beat, notes "murmur since I was a child".  Resp: +SOB GI: see HPI  GU : Denies urinary burning, urinary frequency, urinary hesitancy MS: pain in back, knee pain  Derm: Denies rash, itching, dry skin Psych: Denies depression, anxiety, memory loss, and confusion Heme: see HPI   Physical Exam: BP 159/79 mmHg  Pulse 68  Temp(Src) 98 F (36.7 C)  Ht 5\' 6"  (1.676 m)  Wt 289 lb 3.2 oz (131.18 kg)  BMI 46.70 kg/m2 General:   Alert and oriented. Pleasant and cooperative. Well-nourished and well-developed.  Head:  Normocephalic and atraumatic. Eyes:  Without icterus, sclera clear and conjunctiva pink.  Ears:  Normal auditory acuity. Nose:  No deformity, discharge,  or lesions. Mouth:  No deformity or lesions, oral mucosa pink.  Lungs:  Clear to auscultation bilaterally. No wheezes, rales, or rhonchi. No distress.  Heart:  S1, S2 present with 2/6 systolic murmur  Abdomen:  +BS, soft, obese, non-tender and non-distended. No HSM noted. No guarding or rebound. No masses appreciated.  Rectal:  Deferred  Msk:  Symmetrical without gross deformities. Normal posture. Extremities:  With trace ankle and pedal edema  Neurologic:  Alert and  oriented x4;  grossly normal neurologically. Psych:  Alert and cooperative. Normal mood and affect.

## 2016-01-04 ENCOUNTER — Ambulatory Visit: Admission: RE | Admit: 2016-01-04 | Payer: BLUE CROSS/BLUE SHIELD | Source: Ambulatory Visit

## 2016-01-04 ENCOUNTER — Ambulatory Visit
Admission: RE | Admit: 2016-01-04 | Discharge: 2016-01-04 | Disposition: A | Discharge status: Home / Self Care | Payer: BLUE CROSS/BLUE SHIELD | Source: Ambulatory Visit | Attending: Family Medicine | Admitting: Family Medicine

## 2016-01-04 ENCOUNTER — Other Ambulatory Visit: Payer: Self-pay | Admitting: Family Medicine

## 2016-01-04 DIAGNOSIS — N289 Disorder of kidney and ureter, unspecified: Secondary | ICD-10-CM

## 2016-01-04 DIAGNOSIS — M545 Low back pain: Secondary | ICD-10-CM

## 2016-01-04 MED ORDER — GADOBENATE DIMEGLUMINE 529 MG/ML IV SOLN
20.0000 mL | Freq: Once | INTRAVENOUS | Status: AC | PRN
  Administered 2016-01-04: 20 mL via INTRAVENOUS

## 2016-01-14 ENCOUNTER — Ambulatory Visit (HOSPITAL_COMMUNITY)
Admission: RE | Admit: 2016-01-14 | Discharge: 2016-01-14 | Disposition: A | Discharge status: Home / Self Care | Payer: BLUE CROSS/BLUE SHIELD | Source: Ambulatory Visit | Attending: Gastroenterology | Admitting: Gastroenterology

## 2016-01-14 ENCOUNTER — Encounter (HOSPITAL_COMMUNITY): Payer: Self-pay | Admitting: *Deleted

## 2016-01-14 ENCOUNTER — Encounter (HOSPITAL_COMMUNITY)
Admission: RE | Disposition: A | Discharge status: Home / Self Care | Payer: Self-pay | Source: Ambulatory Visit | Attending: Gastroenterology

## 2016-01-14 DIAGNOSIS — I10 Essential (primary) hypertension: Secondary | ICD-10-CM | POA: Diagnosis not present

## 2016-01-14 DIAGNOSIS — Z79899 Other long term (current) drug therapy: Secondary | ICD-10-CM | POA: Diagnosis not present

## 2016-01-14 DIAGNOSIS — Q438 Other specified congenital malformations of intestine: Secondary | ICD-10-CM | POA: Diagnosis not present

## 2016-01-14 DIAGNOSIS — Z1211 Encounter for screening for malignant neoplasm of colon: Secondary | ICD-10-CM | POA: Insufficient documentation

## 2016-01-14 DIAGNOSIS — K644 Residual hemorrhoidal skin tags: Secondary | ICD-10-CM | POA: Diagnosis not present

## 2016-01-14 HISTORY — PX: COLONOSCOPY: SHX5424

## 2016-01-14 SURGERY — COLONOSCOPY
Anesthesia: Moderate Sedation

## 2016-01-14 MED ORDER — MEPERIDINE HCL 100 MG/ML IJ SOLN
INTRAMUSCULAR | Status: AC
  Filled 2016-01-14: qty 2

## 2016-01-14 MED ORDER — SODIUM CHLORIDE 0.9 % IV SOLN
INTRAVENOUS | Status: DC
  Administered 2016-01-14: 14:00:00 via INTRAVENOUS

## 2016-01-14 MED ORDER — STERILE WATER FOR IRRIGATION IR SOLN
Status: DC | PRN
  Administered 2016-01-14: 2.5 mL

## 2016-01-14 MED ORDER — MIDAZOLAM HCL 5 MG/5ML IJ SOLN
INTRAMUSCULAR | Status: DC | PRN
Start: 2016-01-14 — End: 2016-01-14
  Administered 2016-01-14: 2 mg via INTRAVENOUS
  Administered 2016-01-14: 1 mg via INTRAVENOUS
  Administered 2016-01-14 (×2): 2 mg via INTRAVENOUS

## 2016-01-14 MED ORDER — MIDAZOLAM HCL 5 MG/5ML IJ SOLN
INTRAMUSCULAR | Status: AC
  Filled 2016-01-14: qty 10

## 2016-01-14 MED ORDER — MEPERIDINE HCL 100 MG/ML IJ SOLN
INTRAMUSCULAR | Status: DC | PRN
  Administered 2016-01-14 (×4): 25 mg via INTRAVENOUS

## 2016-01-14 NOTE — H&P (Signed)
  Primary Care Physician:  Maggie Font, MD Primary Gastroenterologist:  Dr. Oneida Alar  Pre-Procedure History & Physical: HPI:  Melissa Garner is a 60 y.o. female here for [place].  Past Medical History  Diagnosis Date  . Hypertension   . Joint pain     Past Surgical History  Procedure Laterality Date  . Abdominal hysterectomy      partial  . Knee surgery    . Carpal tunnel release      Prior to Admission medications   Medication Sig Start Date End Date Taking? Authorizing Provider  acetaminophen (TYLENOL) 500 MG tablet Take 500 mg by mouth every 6 (six) hours as needed for moderate pain.   Yes Historical Provider, MD  amLODipine (NORVASC) 10 MG tablet Take 0.5 tablets (5 mg total) by mouth daily. Patient taking differently: Take 10 mg by mouth daily.  07/12/13  Yes Jola Schmidt, MD  furosemide (LASIX) 20 MG tablet Take 20 mg by mouth 2 (two) times a week. 1 tab on Monday and friday   Yes Historical Provider, MD  ibuprofen (ADVIL,MOTRIN) 200 MG tablet Take 800 mg by mouth every 6 (six) hours as needed for moderate pain.    Yes Historical Provider, MD  lisinopril-hydrochlorothiazide (PRINZIDE,ZESTORETIC) 20-12.5 MG per tablet Take 1 tablet by mouth daily. 07/12/13  Yes Jola Schmidt, MD  polyethylene glycol-electrolytes (TRILYTE) 420 g solution Take 4,000 mLs by mouth as directed. 12/30/15  Yes Danie Binder, MD  traMADol (ULTRAM) 50 MG tablet Take 50 mg by mouth daily as needed for moderate pain.  12/23/15  Yes Historical Provider, MD    Allergies as of 12/30/2015  . (No Known Allergies)    Family History  Problem Relation Age of Onset  . Colon cancer Neg Hx     Social History   Social History  . Marital Status: Single    Spouse Name: N/A  . Number of Children: N/A  . Years of Education: N/A   Occupational History  . nursing assistant    Social History Main Topics  . Smoking status: Never Smoker   . Smokeless tobacco: Not on file  . Alcohol Use: No   . Drug Use: No  . Sexual Activity: Not on file   Other Topics Concern  . Not on file   Social History Narrative    Review of Systems: See HPI, otherwise negative ROS   Physical Exam: BP 144/68 mmHg  Pulse 56  Temp(Src) 98.3 F (36.8 C) (Oral)  Resp 12  SpO2 99% General:   Alert,  pleasant and cooperative in NAD Head:  Normocephalic and atraumatic. Neck:  Supple; Lungs:  Clear throughout to auscultation.    Heart:  Regular rate and rhythm. Abdomen:  Soft, nontender and nondistended. Normal bowel sounds, without guarding, and without rebound.   Neurologic:  Alert and  oriented x4;  grossly normal neurologically.  Impression/Plan:     SCREENING  Plan:  1. TCS TODAY

## 2016-01-14 NOTE — Discharge Instructions (Signed)
You have internal hemorrhoids. YOU DID NOT HAVE ANY POLYPS.   CONTINUE YOUR WEIGHT LOSS EFFORTS. Lose ten pounds. YOUR BODY MASS INDEX IS OVER 40 WHICH MEANS YOU ARE morbidly OBESE. OBESITY INCREASES YOUR RISK FOR ALL CANCERS, INCLUDING COLON CANCER AND SHORTENS YOUR LIFE EXPECTANCY 10 YEARS.  FOLLOW A HIGH FIBER DIET. AVOID ITEMS THAT CAUSE BLOATING. SEE INFO BELOW.  USE PREPARATION H UP TO 4 TIMES A DAY FOR RECTAL BLEEDING, ITCHING, PRESSURE, BURNING, OR PAIN.   Next colonoscopy in 10 years.  Colonoscopy Care After Read the instructions outlined below and refer to this sheet in the next week. These discharge instructions provide you with general information on caring for yourself after you leave the hospital. While your treatment has been planned according to the most current medical practices available, unavoidable complications occasionally occur. If you have any problems or questions after discharge, call DR. Haivyn Oravec, [phone].  ACTIVITY  You may resume your regular activity, but move at a slower pace for the next 24 hours.   Take frequent rest periods for the next 24 hours.   Walking will help get rid of the air and reduce the bloated feeling in your belly (abdomen).   No driving for 24 hours (because of the medicine (anesthesia) used during the test).   You may shower.   Do not sign any important legal documents or operate any machinery for 24 hours (because of the anesthesia used during the test).    NUTRITION  Drink plenty of fluids.   You may resume your normal diet as instructed by your doctor.   Begin with a light meal and progress to your normal diet. Heavy or fried foods are harder to digest and may make you feel sick to your stomach (nauseated).   Avoid alcoholic beverages for 24 hours or as instructed.    MEDICATIONS  You may resume your normal medications.   WHAT YOU CAN EXPECT TODAY  Some feelings of bloating in the abdomen.   Passage of more gas  than usual.   Spotting of blood in your stool or on the toilet paper  .  IF YOU HAD POLYPS REMOVED DURING THE COLONOSCOPY:  Eat a soft diet IF YOU HAVE NAUSEA, BLOATING, ABDOMINAL PAIN, OR VOMITING.    FINDING OUT THE RESULTS OF YOUR TEST Not all test results are available during your visit. DR. Oneida Alar WILL CALL YOU WITHIN 7 DAYS OF YOUR PROCEDUE WITH YOUR RESULTS. Do not assume everything is normal if you have not heard from DR. Seletha Zimmermann IN ONE WEEK, CALL HER OFFICE AT [phone].  SEEK IMMEDIATE MEDICAL ATTENTION AND CALL THE OFFICE: [phone] IF:  You have more than a spotting of blood in your stool.   Your belly is swollen (abdominal distention).   You are nauseated or vomiting.   You have a temperature over 101F.   You have abdominal pain or discomfort that is severe or gets worse throughout the day.  High-Fiber Diet A high-fiber diet changes your normal diet to include more whole grains, legumes, fruits, and vegetables. Changes in the diet involve replacing refined carbohydrates with unrefined foods. The calorie level of the diet is essentially unchanged. The Dietary Reference Intake (recommended amount) for adult males is 38 grams per day. For adult females, it is 25 grams per day. Pregnant and lactating women should consume 28 grams of fiber per day. Fiber is the intact part of a plant that is not broken down during digestion. Functional fiber is fiber that has  been isolated from the plant to provide a beneficial effect in the body. PURPOSE  Increase stool bulk.   Ease and regulate bowel movements.   Lower cholesterol.  REDUCE RISK OF COLON CANCER  INDICATIONS THAT YOU NEED MORE FIBER  Constipation and hemorrhoids.   Uncomplicated diverticulosis (intestine condition) and irritable bowel syndrome.   Weight management.   As a protective measure against hardening of the arteries (atherosclerosis), diabetes, and cancer.   GUIDELINES FOR INCREASING FIBER IN THE  DIET  Start adding fiber to the diet slowly. A gradual increase of about 5 more grams (2 slices of whole-wheat bread, 2 servings of most fruits or vegetables, or 1 bowl of high-fiber cereal) per day is best. Too rapid an increase in fiber may result in constipation, flatulence, and bloating.   Drink enough water and fluids to keep your urine clear or pale yellow. Water, juice, or caffeine-free drinks are recommended. Not drinking enough fluid may cause constipation.   Eat a variety of high-fiber foods rather than one type of fiber.   Try to increase your intake of fiber through using high-fiber foods rather than fiber pills or supplements that contain small amounts of fiber.   The goal is to change the types of food eaten. Do not supplement your present diet with high-fiber foods, but replace foods in your present diet.   INCLUDE A VARIETY OF FIBER SOURCES  Replace refined and processed grains with whole grains, canned fruits with fresh fruits, and incorporate other fiber sources. White rice, white breads, and most bakery goods contain little or no fiber.   Brown whole-grain rice, buckwheat oats, and many fruits and vegetables are all good sources of fiber. These include: broccoli, Brussels sprouts, cabbage, cauliflower, beets, sweet potatoes, white potatoes (skin on), carrots, tomatoes, eggplant, squash, berries, fresh fruits, and dried fruits.   Cereals appear to be the richest source of fiber. Cereal fiber is found in whole grains and bran. Bran is the fiber-rich outer coat of cereal grain, which is largely removed in refining. In whole-grain cereals, the bran remains. In breakfast cereals, the largest amount of fiber is found in those with "bran" in their names. The fiber content is sometimes indicated on the label.   You may need to include additional fruits and vegetables each day.   In baking, for 1 cup white flour, you may use the following substitutions:   1 cup whole-wheat flour  minus 2 tablespoons.   1/2 cup white flour plus 1/2 cup whole-wheat flour.   Hemorrhoids Hemorrhoids are dilated (enlarged) veins around the rectum. Sometimes clots will form in the veins. This makes them swollen and painful. These are called thrombosed hemorrhoids. Causes of hemorrhoids include:  Constipation.   Straining to have a bowel movement.   HEAVY LIFTING  HOME CARE INSTRUCTIONS  Eat a well balanced diet and drink 6 to 8 glasses of water every day to avoid constipation. You may also use a bulk laxative.   Avoid straining to have bowel movements.   Keep anal area dry and clean.   Do not use a donut shaped pillow or sit on the toilet for long periods. This increases blood pooling and pain.   Move your bowels when your body has the urge; this will require less straining and will decrease pain and pressure.

## 2016-01-14 NOTE — Op Note (Addendum)
[facility] Patient Name: Melissa Garner Procedure Date: 01/14/2016 3:01 PM MRN: [id number] Date of Birth: July 17, 1956 Attending MD: Barney Drain , MD CSN: [id number] Age: 60 Admit Type: Outpatient Procedure:                Colonoscopy Indications:              Screening for colorectal malignant neoplasm Providers:                Barney Drain, MD, Gwenlyn Fudge, RN, Zoila Shutter,                            Technologist Referring MD:             Barrie Folk. Hill, MD (Referring MD) Medicines:                Meperidine 100 mg IV, Midazolam 7 mg IV Complications:            No immediate complications. Estimated Blood Loss:     Estimated blood loss: none. Procedure:                Pre-Anesthesia Assessment:                           - Prior to the procedure, a History and Physical                            was performed, and patient medications and                            allergies were reviewed. The patient's tolerance of                            previous anesthesia was also reviewed. The risks                            and benefits of the procedure and the sedation                            options and risks were discussed with the patient.                            All questions were answered, and informed consent                            was obtained. Prior Anticoagulants: The patient has                            taken no previous anticoagulant or antiplatelet                            agents. ASA Grade Assessment: II - A patient with                            mild systemic disease. After reviewing the risks  and benefits, the patient was deemed in                            satisfactory condition to undergo the procedure.                           After obtaining informed consent, the colonoscope                            was passed under direct vision. Throughout the                            procedure, the patient's blood pressure,  pulse, and                            oxygen saturations were monitored continuously. The                            [id number] scope was introduced through                            the anus and advanced to the the cecum, identified                            by appendiceal orifice and ileocecal valve. The                            ileocecal valve, appendiceal orifice, and rectum                            were photographed. The colonoscopy was performed                            without difficulty. The patient tolerated the                            procedure well. The quality of the bowel                            preparation was excellent. Scope In: 3:25:06 PM Scope Out: 3:37:45 PM Scope Withdrawal Time: 0 hours 9 minutes 25 seconds  Total Procedure Duration: 0 hours 12 minutes 39 seconds  Findings:      The digital rectal exam findings include non-thrombosed external       hemorrhoids.      The recto-sigmoid colon was significantly redundant.      The colon (entire examined portion) appeared normal.      Non-bleeding external and internal hemorrhoids were found. The       hemorrhoids were mild. Impression:               - No specimens collected.                           - The entire examined colon is normal. Moderate Sedation:      Moderate (conscious) sedation was administered by the  endoscopy nurse       and supervised by the endoscopist. The following parameters were       monitored: oxygen saturation, heart rate, blood pressure, and response       to care. Total physician intraservice time was 26 minutes. Recommendation:           - Patient has a contact number available for                            emergencies. The signs and symptoms of potential                            delayed complications were discussed with the                            patient. Return to normal activities tomorrow.                            Written discharge instructions were  provided to the                            patient.                           - High fiber diet.                           - Medication reconciliation was performed, and a                            list of the patient's discharge medications was                            provided to the patient.                           - Repeat colonoscopy in 10 years for surveillance                            WITH AN OVERTUBE. Procedure Code(s):        --- Professional ---                           [phone], Colonoscopy, flexible; diagnostic, including                            collection of specimen(s) by brushing or washing,                            when performed (separate procedure)                           [id number], Moderate sedation services; each additional                            15 minutes intraservice time  [id number], Moderate sedation services provided by the                            same physician or other qualified health care                            professional performing a gastrointestinal                            endoscopic service that sedation supports,                            requiring the presence of an independent trained                            observer to assist in the monitoring of the                            patient's level of consciousness and physiological                            status; initial 15 minutes of intra-service time;                            patient age 67 years or older (additional time may                            be reported with [phone], as appropriate) Diagnosis Code(s):        --- Professional ---                           Z12.11, Encounter for screening for malignant                            neoplasm of colon CPT copyright 2016 [employer]. All rights reserved. The codes documented in this report are preliminary and upon coder review may  be revised to meet current compliance  requirements. Barney Drain, MD Barney Drain, MD 01/14/2016 4:32:55 PM This report has been signed electronically. Number of Addenda: 0

## 2016-01-19 ENCOUNTER — Encounter (HOSPITAL_COMMUNITY): Payer: Self-pay | Admitting: Gastroenterology

## 2016-01-19 ENCOUNTER — Ambulatory Visit: Payer: BLUE CROSS/BLUE SHIELD | Admitting: Orthopaedic Surgery

## 2016-07-13 ENCOUNTER — Emergency Department (HOSPITAL_COMMUNITY): Payer: 59

## 2016-07-13 ENCOUNTER — Encounter (HOSPITAL_COMMUNITY): Payer: Self-pay | Admitting: Emergency Medicine

## 2016-07-13 ENCOUNTER — Emergency Department (HOSPITAL_COMMUNITY)
Admission: EM | Admit: 2016-07-13 | Discharge: 2016-07-13 | Disposition: A | Discharge status: Home / Self Care | Payer: 59 | Attending: Emergency Medicine | Admitting: Emergency Medicine

## 2016-07-13 DIAGNOSIS — M549 Dorsalgia, unspecified: Secondary | ICD-10-CM

## 2016-07-13 DIAGNOSIS — Y999 Unspecified external cause status: Secondary | ICD-10-CM | POA: Insufficient documentation

## 2016-07-13 DIAGNOSIS — I10 Essential (primary) hypertension: Secondary | ICD-10-CM | POA: Insufficient documentation

## 2016-07-13 DIAGNOSIS — Z79899 Other long term (current) drug therapy: Secondary | ICD-10-CM | POA: Diagnosis not present

## 2016-07-13 DIAGNOSIS — Y9301 Activity, walking, marching and hiking: Secondary | ICD-10-CM | POA: Diagnosis not present

## 2016-07-13 DIAGNOSIS — W109XXA Fall (on) (from) unspecified stairs and steps, initial encounter: Secondary | ICD-10-CM | POA: Diagnosis not present

## 2016-07-13 DIAGNOSIS — Z791 Long term (current) use of non-steroidal anti-inflammatories (NSAID): Secondary | ICD-10-CM | POA: Diagnosis not present

## 2016-07-13 DIAGNOSIS — W19XXXA Unspecified fall, initial encounter: Secondary | ICD-10-CM

## 2016-07-13 DIAGNOSIS — S8012XA Contusion of left lower leg, initial encounter: Secondary | ICD-10-CM | POA: Insufficient documentation

## 2016-07-13 DIAGNOSIS — T148XXA Other injury of unspecified body region, initial encounter: Secondary | ICD-10-CM

## 2016-07-13 DIAGNOSIS — M545 Low back pain: Secondary | ICD-10-CM | POA: Insufficient documentation

## 2016-07-13 DIAGNOSIS — S5012XA Contusion of left forearm, initial encounter: Secondary | ICD-10-CM | POA: Insufficient documentation

## 2016-07-13 DIAGNOSIS — Y92009 Unspecified place in unspecified non-institutional (private) residence as the place of occurrence of the external cause: Secondary | ICD-10-CM | POA: Insufficient documentation

## 2016-07-13 DIAGNOSIS — S8992XA Unspecified injury of left lower leg, initial encounter: Secondary | ICD-10-CM | POA: Diagnosis present

## 2016-07-13 MED ORDER — HYDROCODONE-ACETAMINOPHEN 5-325 MG PO TABS
2.0000 | ORAL_TABLET | Freq: Once | ORAL | Status: AC
  Administered 2016-07-13: 2 via ORAL
  Filled 2016-07-13: qty 2

## 2016-07-13 MED ORDER — HYDROCODONE-ACETAMINOPHEN 5-325 MG PO TABS: 2.0000 | ORAL_TABLET | ORAL | 0 refills | Status: DC | PRN

## 2016-07-13 MED ORDER — IBUPROFEN 800 MG PO TABS: 800.0000 mg | ORAL_TABLET | Freq: Three times a day (TID) | ORAL | 0 refills | Status: DC

## 2016-07-13 NOTE — ED Triage Notes (Signed)
Pt reports slide down 10 steps this am. Pt reports right leg was caught in banister. No deformity noted, pt unable to bear weight. Pt reports right flank pain radiating to right leg/ankle. Pt reports left arm pain post fall as well. nad noted. Pt alert and oriented. Pt denies loc.

## 2016-07-13 NOTE — ED Notes (Signed)
Pain in right knee, right ankle, left lower leg, lt forearm and right posterior rib area. Bruising noted to forearm and right knee

## 2016-07-14 NOTE — ED Provider Notes (Signed)
[facility] DEPT Provider Note   CSN: [id number] Arrival date & time: 07/13/16  1127     History   Chief Complaint Chief Complaint  Patient presents with  . Fall    HPI Melissa Garner is a 60 y.o. female.  The history is provided by the patient. No language interpreter was used.  Fall  This is a new problem. The problem occurs constantly. The problem has been gradually worsening. Nothing aggravates the symptoms. Nothing relieves the symptoms. She has tried nothing for the symptoms.  Pt fell on steps.  Pt complains of pain to right knee and low back.  Pt has pain with bending right knee.  Difficulty walking.  Pt also has bruising to left arm and left lower leg  Past Medical History:  Diagnosis Date  . Hypertension   . Joint pain     Patient Active Problem List   Diagnosis Date Noted  . Special screening for malignant neoplasms, colon   . Encounter for screening colonoscopy 12/30/2015  . Constipation 12/30/2015  . HYPERLIPIDEMIA-MIXED 11/30/2009  . Unspecified essential hypertension 11/30/2009  . DYSPNEA ON EXERTION 11/30/2009  . MORBID OBESITY 04/30/2009  . KNEE, ARTHRITIS, DEGEN./OSTEO 04/27/2009    Past Surgical History:  Procedure Laterality Date  . ABDOMINAL HYSTERECTOMY     partial  . CARPAL TUNNEL RELEASE    . COLONOSCOPY N/A 01/14/2016   Procedure: COLONOSCOPY;  Surgeon: Danie Binder, MD;  Location: [facility];  Service: Endoscopy;  Laterality: N/A;  230   . KNEE SURGERY      OB History    No data available       Home Medications    Prior to Admission medications   Medication Sig Start Date End Date Taking? Authorizing Provider  acetaminophen (TYLENOL) 500 MG tablet Take 500 mg by mouth every 6 (six) hours as needed for moderate pain.    Historical Provider, MD  amLODipine (NORVASC) 10 MG tablet Take 0.5 tablets (5 mg total) by mouth daily. Patient taking differently: Take 10 mg by mouth daily.  07/12/13   Jola Schmidt, MD  furosemide  (LASIX) 20 MG tablet Take 20 mg by mouth 2 (two) times a week. 1 tab on Monday and friday    Historical Provider, MD  HYDROcodone-acetaminophen (NORCO/VICODIN) 5-325 MG tablet Take 2 tablets by mouth every 4 (four) hours as needed. 07/13/16   Fransico Meadow, PA-C  ibuprofen (ADVIL,MOTRIN) 800 MG tablet Take 1 tablet (800 mg total) by mouth 3 (three) times daily. 07/13/16   Fransico Meadow, PA-C  lisinopril-hydrochlorothiazide (PRINZIDE,ZESTORETIC) 20-12.5 MG per tablet Take 1 tablet by mouth daily. 07/12/13   Jola Schmidt, MD  polyethylene glycol-electrolytes (TRILYTE) 420 g solution Take 4,000 mLs by mouth as directed. 12/30/15   Danie Binder, MD  traMADol (ULTRAM) 50 MG tablet Take 50 mg by mouth daily as needed for moderate pain.  12/23/15   Historical Provider, MD    Family History Family History  Problem Relation Age of Onset  . Colon cancer Neg Hx     Social History Social History  Substance Use Topics  . Smoking status: Never Smoker  . Smokeless tobacco: Never Used  . Alcohol use No     Allergies   Review of patient's allergies indicates no known allergies.   Review of Systems Review of Systems  All other systems reviewed and are negative.    Physical Exam Updated Vital Signs BP 118/57   Pulse (!) 58   Temp 98.5 F (  36.9 C) (Oral)   Resp 18   Ht 5\' 6"  (1.676 m)   Wt 87.1 kg   SpO2 100%   BMI 30.99 kg/m   Physical Exam  Constitutional: She appears well-developed and well-nourished. No distress.  HENT:  Head: Normocephalic and atraumatic.  Eyes: Conjunctivae are normal.  Neck: Neck supple.  Cardiovascular: Normal rate and regular rhythm.   No murmur heard. Pulmonary/Chest: Effort normal and breath sounds normal. No respiratory distress.  Abdominal: Soft. There is no tenderness.  Musculoskeletal: She exhibits no edema.  Tender low back diffusely,  Tender right knee, pain with movement,  Bruising left forearm, nontender,  Bruising left lower leg,  nontender    Neurological: She is alert.  Skin: Skin is warm and dry.  Psychiatric: She has a normal mood and affect.  Nursing note and vitals reviewed.    ED Treatments / Results  Labs (all labs ordered are listed, but only abnormal results are displayed) Labs Reviewed - No data to display  EKG  EKG Interpretation None       Radiology Dg Lumbar Spine Complete  Result Date: 07/13/2016 CLINICAL DATA:  Fall, back pain EXAM: LUMBAR SPINE - COMPLETE 4+ VIEW COMPARISON:  None. FINDINGS: Five lumbar-type vertebral bodies. Normal lumbar lordosis. No evidence of fracture or dislocation. Vertebral body heights are maintained. Mild multilevel degenerative changes. Visualized bony pelvis appears intact IMPRESSION: No fracture or dislocation is seen. Electronically Signed   By: Julian Hy M.D.   On: 07/13/2016 12:59   Dg Knee Complete 4 Views Right  Result Date: 07/13/2016 CLINICAL DATA:  Slipped and fell at home this morning. Right knee pain. EXAM: RIGHT KNEE - COMPLETE 4+ VIEW COMPARISON:  08/22/2008. FINDINGS: Severe tricompartmental degenerative changes with joint space narrowing, osteophytic spurring, subchondral cystic change and bony eburnation. No definite acute fracture or osteochondral lesion. No definite joint effusion. Large superior patellar osteophyte could be remotely fractured. No discrete osteochondral lesion. IMPRESSION: Severe progressive tricompartmental degenerative changes involving the right knee without acute fracture or significant joint effusion. Electronically Signed   By: Marijo Sanes M.D.   On: 07/13/2016 13:21    Procedures Procedures (including critical care time)  Medications Ordered in ED Medications  HYDROcodone-acetaminophen (NORCO/VICODIN) 5-325 MG per tablet 2 tablet (2 tablets Oral Given 07/13/16 1405)     Initial Impression / Assessment and Plan / ED Course  I have reviewed the triage vital signs and the nursing notes.  Pertinent labs & imaging results  that were available during my care of the patient were reviewed by me and considered in my medical decision making (see chart for details).  Clinical Course    Pt has degenerative diseases  No fractures.   Pt placed in a knee imbolizer.  I advised her to follow up with her Orthopaedist (Dr. Alvan Dame) for recheck.    Final Clinical Impressions(s) / ED Diagnoses   Final diagnoses:  Fall, initial encounter  Contusion  Back pain, unspecified location    New Prescriptions Discharge Medication List as of 07/13/2016  1:57 PM    START taking these medications   Details  HYDROcodone-acetaminophen (NORCO/VICODIN) 5-325 MG tablet Take 2 tablets by mouth every 4 (four) hours as needed., Starting Wed 07/13/2016, Print      An After Visit Summary was printed and given to the patient.   Hollace Kinnier [place], PA-C 07/14/16 [place], MD 07/18/16 [phone]

## 2016-07-18 IMAGING — DX DG SHOULDER 2+V*R*
3 series · 3 of 3 positions shown · non-contrast
Comparison: None.

CLINICAL DATA: Progressive right shoulder pain over past 4 months

EXAM:
RIGHT SHOULDER - 2+ VIEW

[shoulder y view]
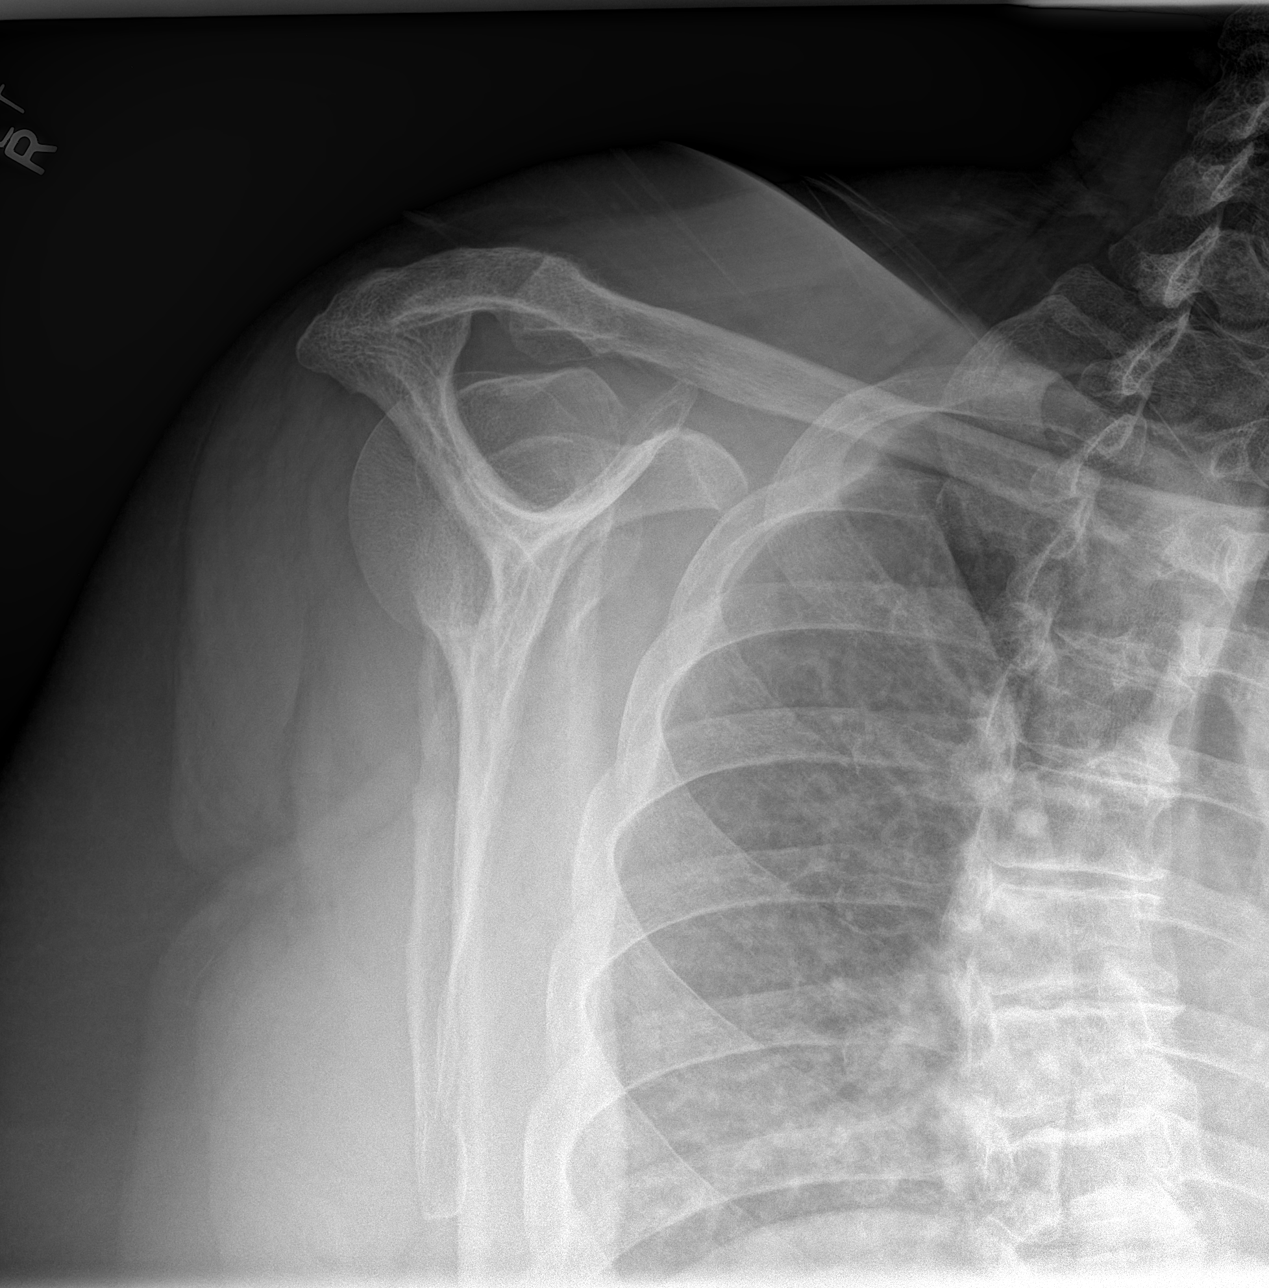

[shoulder axillary]
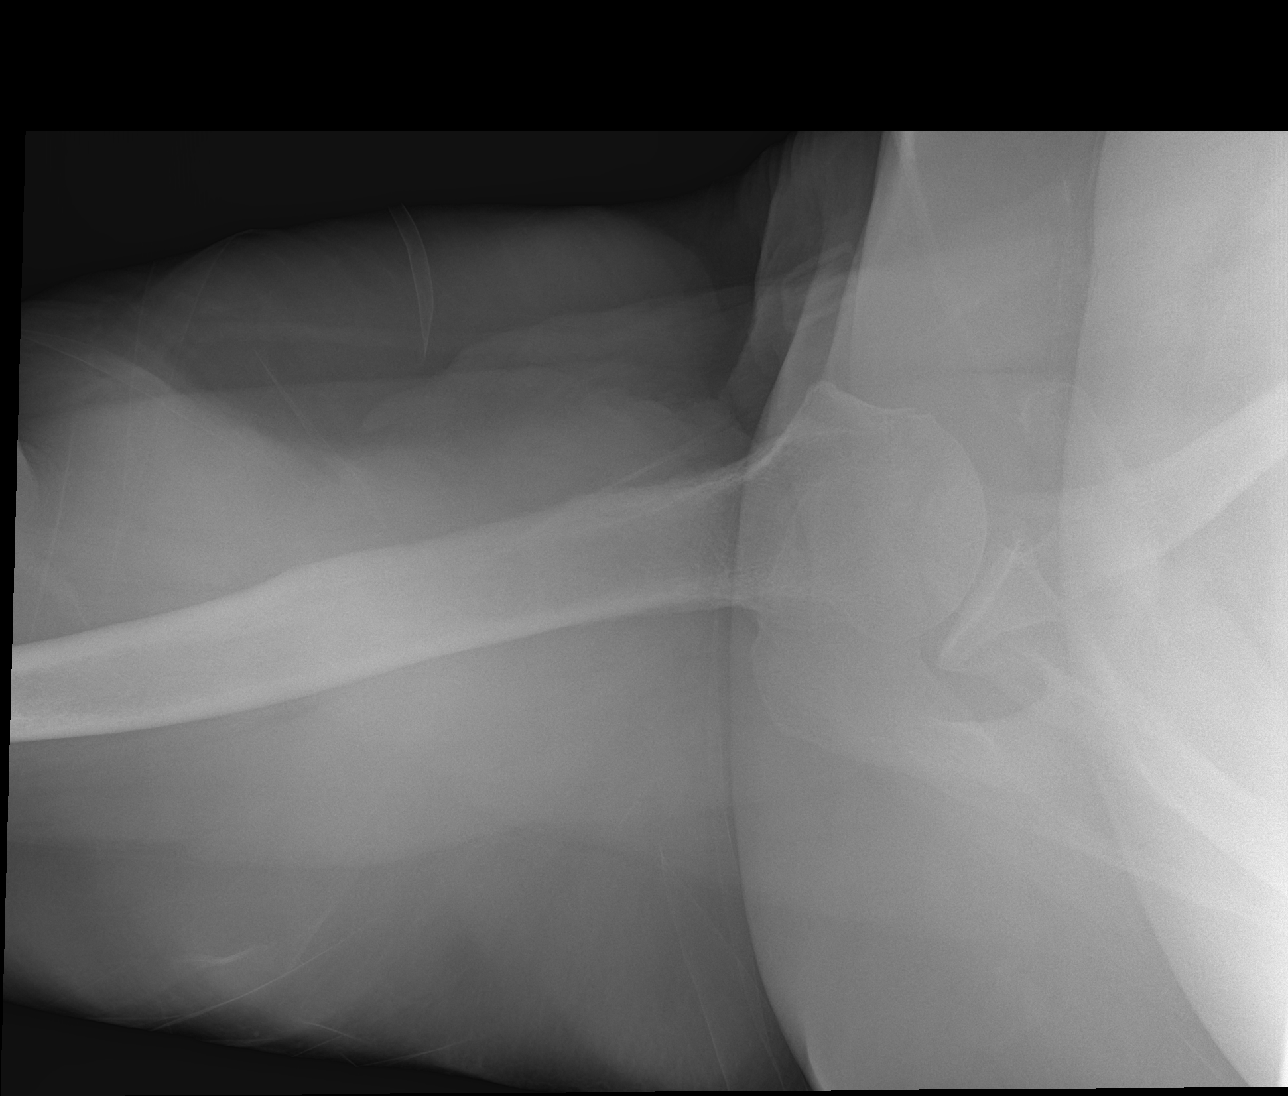

[shoulder grashey]
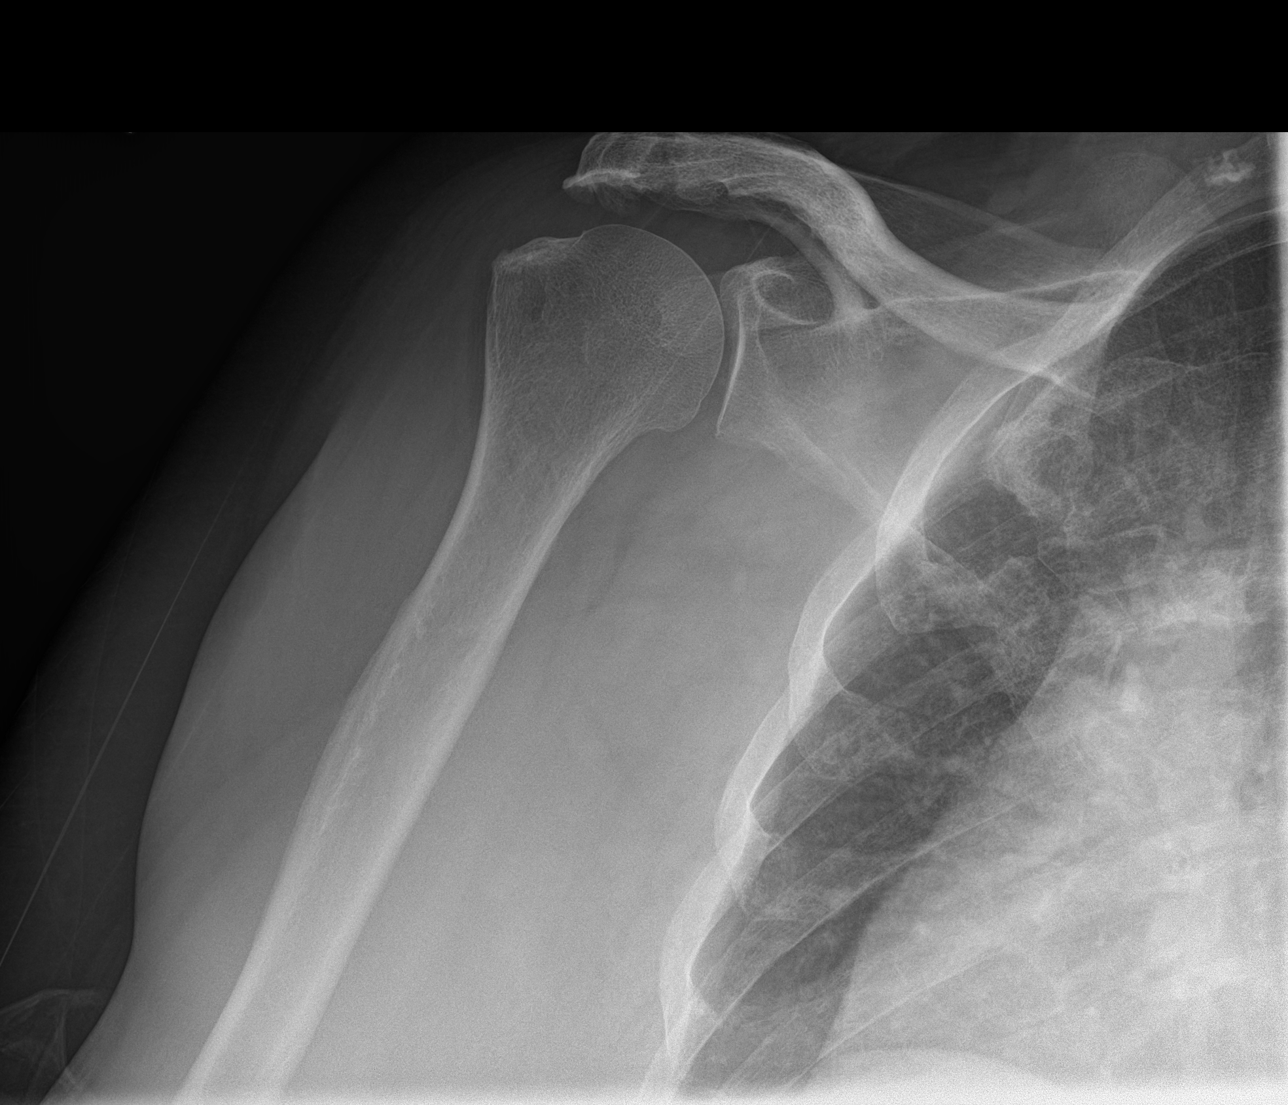

[3 of 3 positions shown; findings below may reference images not displayed]

FINDINGS: Frontal, Y scapular, and axillary images were obtained. There is no
fracture or dislocation. There is osteoarthritic change in the
acromioclavicular joint. The glenohumeral joint appears
unremarkable. No erosive change or intra-articular calcification.
IMPRESSION: Osteoarthritic change in the acromioclavicular joint. No fracture or
dislocation.

## 2016-07-19 ENCOUNTER — Encounter: Payer: Self-pay | Admitting: Orthopaedic Surgery

## 2016-07-19 ENCOUNTER — Ambulatory Visit (INDEPENDENT_AMBULATORY_CARE_PROVIDER_SITE_OTHER): Payer: PRIVATE HEALTH INSURANCE | Admitting: Orthopaedic Surgery

## 2016-07-19 ENCOUNTER — Telehealth: Payer: Self-pay | Admitting: Orthopedic Surgery

## 2016-07-19 VITALS — BP 148/71 | HR 71 | Temp 97.5°F | Ht 64.0 in | Wt 290.0 lb

## 2016-07-19 DIAGNOSIS — M48061 Spinal stenosis, lumbar region without neurogenic claudication: Secondary | ICD-10-CM

## 2016-07-19 DIAGNOSIS — M4806 Spinal stenosis, lumbar region: Secondary | ICD-10-CM

## 2016-07-19 DIAGNOSIS — M25561 Pain in right knee: Secondary | ICD-10-CM

## 2016-07-19 NOTE — Patient Instructions (Signed)
OUT OF WORK X 3 WEEKS  SEE DR HILL FOR WEIGHT LOSS

## 2016-07-19 NOTE — Progress Notes (Signed)
Subjective: My right knee hurts    Patient ID: Melissa Garner, female    DOB: October 06, 1956, 60 y.o.   MRN: [id number]  HPI She fell on 07-13-16 and hurt her back and her right knee.  Her back is better.  Her right knee is worse.  She is using a walker, ice, heat, elevation.  She is not improved.  X-rays of the right knee shows severe tricompartmental degenerative disease.  She has had a total knee on the left about six years ago.  She says the right knee has gotten worse over the last six months. She has had giving way of the knee, pain, swelling.  It just hurts more after the recent fall.  She would like to have a total knee on the right.  I will have her talk to Dr. Aline Brochure about this.  She is agreeable.  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  She takes Ibuprofen 800 qid for the pain.  Review of Systems  HENT: Negative for congestion.   Respiratory: Negative for cough and shortness of breath.   Cardiovascular: Negative for chest pain and leg swelling.  Endocrine: Positive for cold intolerance.  Musculoskeletal: Positive for arthralgias, back pain, gait problem and joint swelling.  Allergic/Immunologic: Positive for environmental allergies.   Past Medical History:  Diagnosis Date  . Hypertension   . Joint pain     Past Surgical History:  Procedure Laterality Date  . ABDOMINAL HYSTERECTOMY     partial  . CARPAL TUNNEL RELEASE    . COLONOSCOPY N/A 01/14/2016   Procedure: COLONOSCOPY;  Surgeon: Danie Binder, MD;  Location: [facility];  Service: Endoscopy;  Laterality: N/A;  230   . KNEE SURGERY      Current Outpatient Prescriptions on File Prior to Visit  Medication Sig Dispense Refill  . amLODipine (NORVASC) 10 MG tablet Take 0.5 tablets (5 mg total) by mouth daily. (Patient taking differently: Take 10 mg by mouth daily. ) 30 tablet 0  . furosemide (LASIX) 20 MG tablet Take 20 mg by mouth 2 (two) times a week. 1 tab on Monday and friday    . ibuprofen  (ADVIL,MOTRIN) 800 MG tablet Take 1 tablet (800 mg total) by mouth 3 (three) times daily. 21 tablet 0  . lisinopril-hydrochlorothiazide (PRINZIDE,ZESTORETIC) 20-12.5 MG per tablet Take 1 tablet by mouth daily. 30 tablet 1  . traMADol (ULTRAM) 50 MG tablet Take 50 mg by mouth daily as needed for moderate pain.   0  . acetaminophen (TYLENOL) 500 MG tablet Take 500 mg by mouth every 6 (six) hours as needed for moderate pain.    Marland Kitchen HYDROcodone-acetaminophen (NORCO/VICODIN) 5-325 MG tablet Take 2 tablets by mouth every 4 (four) hours as needed. 20 tablet 0  . polyethylene glycol-electrolytes (TRILYTE) 420 g solution Take 4,000 mLs by mouth as directed. 4000 mL 0   No current facility-administered medications on file prior to visit.     Social History   Social History  . Marital status: Single    Spouse name: N/A  . Number of children: N/A  . Years of education: N/A   Occupational History  . nursing assistant    Social History Main Topics  . Smoking status: Never Smoker  . Smokeless tobacco: Never Used  . Alcohol use No  . Drug use: No  . Sexual activity: Not on file   Other Topics Concern  . Not on file   Social History Narrative  . No narrative on file  Family History  Problem Relation Age of Onset  . Heart failure Mother   . Alcoholism Father   . Cancer Father   . COPD Sister   . Hypertension Sister   . Arthritis Sister   . Seizures Sister   . Colon cancer Neg Hx     BP (!) 148/71   Pulse 71   Temp 97.5 F (36.4 C)   Ht 5\' 4"  (1.626 m)   Wt 290 lb (131.5 kg)   BMI 49.78 kg/m      Objective:   Physical Exam  Constitutional: She is oriented to person, place, and time. She appears well-developed and well-nourished.  HENT:  Head: Normocephalic and atraumatic.  Eyes: Conjunctivae and EOM are normal. Pupils are equal, round, and reactive to light.  Neck: Normal range of motion. Neck supple.  Cardiovascular: Normal rate, regular rhythm and intact distal pulses.    Pulmonary/Chest: Effort normal.  Abdominal: Soft.  Musculoskeletal: She exhibits tenderness (Prin right knee.  Effusion, crepitus present. ROM 0 to 105 with pain.  Limp to right. Uses a walker.  No distal edema.  Left knee with midline scar and ROM 0 to 115.).  Neurological: She is alert and oriented to person, place, and time. She displays normal reflexes. No cranial nerve deficit. She exhibits normal muscle tone. Coordination normal.  Skin: Skin is warm and dry.  Psychiatric: She has a normal mood and affect. Her behavior is normal. Judgment and thought content normal.          Assessment & Plan:   Encounter Diagnoses  Name Primary?  . Right knee pain Yes  . Morbid obesity due to excess calories [facility])    I will have her talk to Dr. Aline Brochure about a total knee on the right.   Call if any problem.  Precautions discussed.  Electronically Signed Sanjuana Kava, MD 9/19/20173:12 PM

## 2016-07-19 NOTE — Telephone Encounter (Signed)
Please advise regarding appointment slot for consult to discuss total knee surgery.

## 2016-08-01 ENCOUNTER — Ambulatory Visit
Admission: RE | Admit: 2016-08-01 | Discharge: 2016-08-01 | Disposition: A | Discharge status: Home / Self Care | Payer: 59 | Source: Ambulatory Visit | Attending: Orthopedic Surgery | Admitting: Orthopedic Surgery

## 2016-08-01 DIAGNOSIS — M48061 Spinal stenosis, lumbar region without neurogenic claudication: Secondary | ICD-10-CM

## 2016-08-01 MED ORDER — METHYLPREDNISOLONE ACETATE 40 MG/ML INJ SUSP (RADIOLOG
120.0000 mg | Freq: Once | INTRAMUSCULAR | Status: AC
  Administered 2016-08-01: 120 mg via EPIDURAL

## 2016-08-01 MED ORDER — IOPAMIDOL (ISOVUE-M 200) INJECTION 41%
1.0000 mL | Freq: Once | INTRAMUSCULAR | Status: AC
  Administered 2016-08-01: 1 mL via EPIDURAL

## 2016-08-01 NOTE — Discharge Instructions (Signed)

## 2016-08-01 NOTE — Telephone Encounter (Signed)
Schedule appt when we have available time do not overbook

## 2016-08-01 NOTE — Telephone Encounter (Signed)
Patient called back to check on when she may see Dr Aline Brochure to discuss surgery; states has had her epidural steroid injection today, 08/01/16, as scheduled.  Please advise of appointment to discuss total knee - ph3 [phone]

## 2016-08-02 NOTE — Telephone Encounter (Signed)
08/01/16 called patient; appointment scheduled.

## 2016-08-08 ENCOUNTER — Encounter: Payer: Self-pay | Admitting: Orthopedic Surgery

## 2016-08-08 ENCOUNTER — Ambulatory Visit (INDEPENDENT_AMBULATORY_CARE_PROVIDER_SITE_OTHER): Payer: PRIVATE HEALTH INSURANCE | Admitting: Orthopedic Surgery

## 2016-08-08 VITALS — BP 135/89 | HR 108 | Wt 287.0 lb

## 2016-08-08 DIAGNOSIS — M25561 Pain in right knee: Secondary | ICD-10-CM

## 2016-08-08 DIAGNOSIS — G8929 Other chronic pain: Secondary | ICD-10-CM | POA: Diagnosis not present

## 2016-08-08 DIAGNOSIS — M48061 Spinal stenosis, lumbar region without neurogenic claudication: Secondary | ICD-10-CM

## 2016-08-08 NOTE — Patient Instructions (Signed)
Return to work [employer] Oct 16th

## 2016-08-08 NOTE — Progress Notes (Signed)
Patient ID: Melissa Garner, female   DOB: 11-28-1955, 60 y.o.   MRN: [id number]  Chief Complaint  Patient presents with  . Knee Problem    discuss right knee replacement, referred by Dr Luna Glasgow    HPI Melissa Garner is a 60 y.o. female.   HPI She had a epidural injection did well most of her back and radicular symptoms are resolved  She still having some knee pain controlled by ibuprofen   Review of Systems Review of Systems Normal neuro  Denies fever  Examination BP 135/89   Pulse (!) 108   Wt 287 lb (130.2 kg)   BMI 49.26 kg/m   Gen. appearance the patient's appearance is normal with normal grooming and  hygiene The patient is oriented to person place and time Mood and affect are normal   Ortho Exam Gait is remarkable for mildly antalgic waddling gait with Trendelenburg type gait  Motor exam 5/5 manual muscle testing , no atrophy  Skin is normal (no rash or erythema)    Medical decision-making Diagnosis, Data, Plan (risk)  Encounter Diagnoses  Name Primary?  . Chronic pain of right knee Yes  . Morbid obesity due to excess calories ([place])   . Spinal stenosis of lumbar region without neurogenic claudication    Plan 3 month follow-up repeat knee films did not have patellar view on last x-ray  Return to work October 16  Continue ibuprofen.  Arther Abbott, MD 08/08/2016 10:27 AM

## 2016-08-17 ENCOUNTER — Telehealth: Payer: Self-pay | Admitting: Orthopedic Surgery

## 2016-08-17 NOTE — Telephone Encounter (Signed)
Called back to patient and relayed.  Patient said she is speaking with her employer; states her primary care, Dr Berdine Addison, has her on FMLA, and she will further discuss the matter with Dr Berdine Addison at her upcoming appointment.

## 2016-08-17 NOTE — Telephone Encounter (Signed)
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  She had been seen by Dr Aline Brochure per Dr Luna Glasgow to discuss total knee surgery, which is pending.  Patient said she's had the epidural injection 08/01/16.  States she does not know what to do about work.  Patient's next scheduled appointment is 11/07/16.  Ph [phone]  I relayed that she should also speak with employer - ?possible intermittent FMLA?  Please advise if any recommendations.

## 2016-08-17 NOTE — Telephone Encounter (Signed)
No additional advice   I can not do surgery until she gets under BMI 40  If epidural NOT working needs referral to neurosurgeon

## 2016-09-01 ENCOUNTER — Encounter (HOSPITAL_COMMUNITY): Payer: Self-pay | Admitting: Emergency Medicine

## 2016-09-01 ENCOUNTER — Emergency Department (HOSPITAL_COMMUNITY)
Admission: EM | Admit: 2016-09-01 | Discharge: 2016-09-01 | Disposition: A | Discharge status: Home / Self Care | Payer: PRIVATE HEALTH INSURANCE | Attending: Emergency Medicine | Admitting: Emergency Medicine

## 2016-09-01 DIAGNOSIS — R55 Syncope and collapse: Secondary | ICD-10-CM | POA: Diagnosis not present

## 2016-09-01 DIAGNOSIS — Z791 Long term (current) use of non-steroidal anti-inflammatories (NSAID): Secondary | ICD-10-CM | POA: Diagnosis not present

## 2016-09-01 DIAGNOSIS — R001 Bradycardia, unspecified: Secondary | ICD-10-CM | POA: Insufficient documentation

## 2016-09-01 DIAGNOSIS — I10 Essential (primary) hypertension: Secondary | ICD-10-CM | POA: Diagnosis not present

## 2016-09-01 DIAGNOSIS — R110 Nausea: Secondary | ICD-10-CM | POA: Insufficient documentation

## 2016-09-01 DIAGNOSIS — Z79899 Other long term (current) drug therapy: Secondary | ICD-10-CM | POA: Diagnosis not present

## 2016-09-01 LAB — BASIC METABOLIC PANEL
Anion gap: 5 (ref 5–15)
BUN: 9 mg/dL (ref 6–20)
CALCIUM: 9.9 mg/dL (ref 8.9–10.3)
CO2: 29 mmol/L (ref 22–32)
CREATININE: 0.68 mg/dL (ref 0.44–1.00)
Chloride: 102 mmol/L (ref 101–111)
GLUCOSE: 125 mg/dL — AB (ref 65–99)
Potassium: 3.2 mmol/L — ABNORMAL LOW (ref 3.5–5.1)
Sodium: 136 mmol/L (ref 135–145)

## 2016-09-01 LAB — CBC
HCT: 36.6 % (ref 36.0–46.0)
Hemoglobin: 11.8 g/dL — ABNORMAL LOW (ref 12.0–15.0)
MCH: 26.5 pg (ref 26.0–34.0)
MCHC: 32.2 g/dL (ref 30.0–36.0)
MCV: 82.1 fL (ref 78.0–100.0)
PLATELETS: 208 10*3/uL (ref 150–400)
RBC: 4.46 MIL/uL (ref 3.87–5.11)
RDW: 13.9 % (ref 11.5–15.5)
WBC: 4.4 10*3/uL (ref 4.0–10.5)

## 2016-09-01 LAB — TROPONIN I

## 2016-09-01 LAB — URINALYSIS, ROUTINE W REFLEX MICROSCOPIC
BILIRUBIN URINE: NEGATIVE
Glucose, UA: NEGATIVE mg/dL
HGB URINE DIPSTICK: NEGATIVE
KETONES UR: NEGATIVE mg/dL
Leukocytes, UA: NEGATIVE
Nitrite: NEGATIVE
PROTEIN: NEGATIVE mg/dL
Specific Gravity, Urine: 1.025 (ref 1.005–1.030)
pH: 6 (ref 5.0–8.0)

## 2016-09-01 NOTE — Discharge Instructions (Signed)

## 2016-09-01 NOTE — ED Triage Notes (Signed)
Pt reports syncopal episode this morning, denies head injury. Pt has hx of vertigo, pt nauseated at this time, alert and oriented.  cbg 133

## 2016-09-01 NOTE — ED Provider Notes (Signed)
Emergency Department Provider Note   I have reviewed the triage vital signs and the nursing notes.   HISTORY  Chief Complaint Loss of Consciousness   HPI Melissa Garner is a 60 y.o. female with PMH of HTN and obesity presents to the emergency department for evaluation of syncope this morning. Patient states that she was getting out of bed to get her grandchild ready for the day when she suddenly felt very lightheaded and hot all over. Shortly getting a cool rag to place in the back of her neck gotten significantly improved symptoms. She states she passed out for several seconds and fell back onto the bed. No head injury. No preceding chest pain, palpitations, diaphoresis. Patient reports feeling slightly nauseated. She has no history of seizures. She denies taking any pain medications or blood pressure medications prior to syncope. No changes in the dose or frequency of his medications. She reports a remote history of syncope that was blamed on vertigo and this felt somewhat similar. She denies any visual shifting or spinning in the room.   Past Medical History:  Diagnosis Date  . Hypertension   . Joint pain     Patient Active Problem List   Diagnosis Date Noted  . Special screening for malignant neoplasms, colon   . Encounter for screening colonoscopy 12/30/2015  . Constipation 12/30/2015  . HYPERLIPIDEMIA-MIXED 11/30/2009  . Unspecified essential hypertension 11/30/2009  . DYSPNEA ON EXERTION 11/30/2009  . MORBID OBESITY 04/30/2009  . KNEE, ARTHRITIS, DEGEN./OSTEO 04/27/2009    Past Surgical History:  Procedure Laterality Date  . ABDOMINAL HYSTERECTOMY     partial  . CARPAL TUNNEL RELEASE    . COLONOSCOPY N/A 01/14/2016   Procedure: COLONOSCOPY;  Surgeon: Danie Binder, MD;  Location: [facility];  Service: Endoscopy;  Laterality: N/A;  230   . KNEE SURGERY      Current Outpatient Rx  . Order #: [id number] Class: Historical Med  . Order #: [id number] Class: Print    . Order #: [id number] Class: Historical Med  . Order #: [id number] Class: Print  . Order #: [id number] Class: Print  . Order #: [id number] Class: Print  . Order #: [id number] Class: Historical Med  . Order #: [id number] Class: Normal  . Order #: [id number] Class: Historical Med    Allergies Review of patient's allergies indicates no known allergies.  Family History  Problem Relation Age of Onset  . Heart failure Mother   . Alcoholism Father   . Cancer Father   . COPD Sister   . Hypertension Sister   . Arthritis Sister   . Seizures Sister   . Colon cancer Neg Hx     Social History Social History  Substance Use Topics  . Smoking status: Never Smoker  . Smokeless tobacco: Never Used  . Alcohol use No    Review of Systems  Constitutional: No fever/chills. Positive syncope.  Eyes: No visual changes. ENT: No sore throat. Cardiovascular: Denies chest pain. Respiratory: Denies shortness of breath. Gastrointestinal: No abdominal pain. Positive nausea, no vomiting.  No diarrhea.  No constipation. Genitourinary: Negative for dysuria. Musculoskeletal: Negative for back pain. Skin: Negative for rash. Neurological: Negative for headaches, focal weakness or numbness.  10-point ROS otherwise negative.  ____________________________________________   PHYSICAL EXAM:  VITAL SIGNS: ED Triage Vitals  Enc Vitals Group     BP 09/01/16 0709 123/67     Pulse Rate 09/01/16 0709 (!) 57     Resp 09/01/16 0709 18     Temp 09/01/16 0709 97.8 F (36.6 C)  Temp Source 09/01/16 0709 Oral     SpO2 09/01/16 0709 99 %     Weight 09/01/16 0707 290 lb (131.5 kg)     Height 09/01/16 0707 5\' 5"  (1.651 m)   Constitutional: Alert and oriented. Well appearing and in no acute distress. Eyes: Conjunctivae are normal.  Head: Atraumatic. Nose: No congestion/rhinnorhea. Mouth/Throat: Mucous membranes are moist.  Oropharynx non-erythematous. Neck: No stridor.   Cardiovascular: Bradycardia. Good peripheral  circulation. Grossly normal heart sounds.   Respiratory: Normal respiratory effort.  No retractions. Lungs CTAB. Gastrointestinal: Soft and nontender. No distention.  Musculoskeletal: No lower extremity tenderness nor edema. No gross deformities of extremities. Neurologic:  Normal speech and language. No gross focal neurologic deficits are appreciated.  Skin:  Skin is warm, dry and intact. No rash noted.  ____________________________________________   LABS (all labs ordered are listed, but only abnormal results are displayed)  Labs Reviewed  BASIC METABOLIC PANEL - Abnormal; Notable for the following:       Result Value   Potassium 3.2 (*)    Glucose, Bld 125 (*)    All other components within normal limits  CBC - Abnormal; Notable for the following:    Hemoglobin 11.8 (*)    All other components within normal limits  URINALYSIS, ROUTINE W REFLEX MICROSCOPIC (NOT AT [facility])  TROPONIN I  CBG MONITORING, ED   ____________________________________________  EKG   EKG Interpretation  Date/Time:  Thursday September 01 2016 07:08:47 EDT Ventricular Rate:  59 PR Interval:    QRS Duration: 113 QT Interval:  421 QTC Calculation: 417 R Axis:   53 Text Interpretation:  Sinus rhythm Prolonged PR interval Borderline intraventricular conduction delay No STEMI.  Confirmed by Leelah Hanna MD, Maycie Luera [phone]) on 09/01/2016 7:11:13 AM       ____________________________________________  RADIOLOGY  None ____________________________________________   PROCEDURES  Procedure(s) performed:   Procedures  None ____________________________________________   INITIAL IMPRESSION / ASSESSMENT AND PLAN / ED COURSE  Pertinent labs & imaging results that were available during my care of the patient were reviewed by me and considered in my medical decision making (see chart for details).  Patient resents emergency department for evaluation of syncope. By history this seems most consistent with  vasovagal syncope. No vertigo component. No other neurological deficit on my exam. Patient has elevated blood pressure and obesity but no history of congestive heart failure or anemia. No changes to medications. Plan for blood work and urinalysis along with a brief ED observation on the monitor.   08:30 AM Patient is feeling well. Labs are unremarkable. She does have sinus bradycardia on monitor but this is also seen in September 2014 EKG on comparison. Does not appear to be new. No beta blocking medication at home. Plan for PCP follow up this week. Discussed return precautions in detail.   At this time, I do not feel there is any life-threatening condition present. I have reviewed and discussed all results (EKG, imaging, lab, urine as appropriate), exam findings with patient. I have reviewed nursing notes and appropriate previous records.  I feel the patient is safe to be discharged home without further emergent workup. Discussed usual and customary return precautions. Patient and family (if present) verbalize understanding and are comfortable with this plan.  Patient will follow-up with their primary care provider. If they do not have a primary care provider, information for follow-up has been provided to them. All questions have been answered.  ____________________________________________  FINAL CLINICAL IMPRESSION(S) / ED DIAGNOSES  Final  diagnoses:  Syncope and collapse     MEDICATIONS GIVEN DURING THIS VISIT:  None  NEW OUTPATIENT MEDICATIONS STARTED DURING THIS VISIT:  None   Note:  This document was prepared using Dragon voice recognition software and may include unintentional dictation errors.  Nanda Quinton, MD Emergency Medicine   Margette Fast, MD 09/01/16 [phone]

## 2016-10-21 ENCOUNTER — Ambulatory Visit (INDEPENDENT_AMBULATORY_CARE_PROVIDER_SITE_OTHER): Payer: Self-pay | Admitting: Cardiology

## 2016-10-21 ENCOUNTER — Encounter: Payer: Self-pay | Admitting: Cardiology

## 2016-10-21 VITALS — BP 126/78 | HR 68 | Ht 65.0 in | Wt 289.0 lb

## 2016-10-21 DIAGNOSIS — R55 Syncope and collapse: Secondary | ICD-10-CM

## 2016-10-21 DIAGNOSIS — I10 Essential (primary) hypertension: Secondary | ICD-10-CM

## 2016-10-21 DIAGNOSIS — E782 Mixed hyperlipidemia: Secondary | ICD-10-CM

## 2016-10-21 NOTE — Progress Notes (Signed)
Cardiology Office Note  Date: 10/21/2016   ID: Melissa Garner, DOB 05-10-1956, MRN [id number]  PCP: Maggie Font, MD  Consulting Cardiologist: Rozann Lesches, MD   Chief Complaint  Patient presents with  . Loss of Consciousness    History of Present Illness: Melissa Garner is a 60 y.o. female referred for cardiology consultation by Dr. Berdine Addison. She is a Quarry manager, has worked in Psychologist, sport and exercise most recently. Reports doing well overall, although she has had some trouble with her back reporting a lumbar disc problem and neuropathic left leg pain. Unrelated to this she states that she has had episodes of syncope. The events in question that led to her ER evaluation back in November occurred when she woke up one morning and was sitting on the side of her bed feeling somewhat dizzy as if the room was "spinning." She tried to get up, but apparently fell back on her bed and had a syncopal event. This happened again when she was seated on the bed and witnessed by her granddaughter. She then got up and walked to the bathroom and while coming back had a syncopal event. Since then she has had no symptoms. She reports a prior episode of syncope somewhat similar to this several months ago.  She does not have any history of arrhythmia or known cardiac structural disease. I did review her recent ECG which is outlined below as well as lab work. She does not report any recent medication changes. No other illnesses other than her trouble with lower back pain and leg pain as mentioned above.  Past Medical History:  Diagnosis Date  . Essential hypertension   . Hyperlipidemia   . Lumbar disc disease    Left leg pain  . Obesity   . Osteoarthritis     Past Surgical History:  Procedure Laterality Date  . ABDOMINAL HYSTERECTOMY     Partial  . CARPAL TUNNEL RELEASE    . COLONOSCOPY N/A 01/14/2016   Procedure: COLONOSCOPY;  Surgeon: Danie Binder, MD;  [facility];  Service: Endoscopy;   Laterality: N/A;  230   . KNEE SURGERY      Current Outpatient Prescriptions  Medication Sig Dispense Refill  . acetaminophen (TYLENOL) 500 MG tablet Take 500 mg by mouth every 6 (six) hours as needed for moderate pain.    Marland Kitchen amLODipine (NORVASC) 10 MG tablet Take 0.5 tablets (5 mg total) by mouth daily. (Patient taking differently: Take 10 mg by mouth daily. ) 30 tablet 0  . furosemide (LASIX) 20 MG tablet Take 20 mg by mouth 2 (two) times a week. 1 tab on Monday and friday    . ibuprofen (ADVIL,MOTRIN) 800 MG tablet Take 1 tablet (800 mg total) by mouth 3 (three) times daily. 21 tablet 0  . lisinopril-hydrochlorothiazide (PRINZIDE,ZESTORETIC) 20-12.5 MG per tablet Take 1 tablet by mouth daily. 30 tablet 1  . PHENTERMINE HCL PO Take by mouth.    . polyethylene glycol-electrolytes (TRILYTE) 420 g solution Take 4,000 mLs by mouth as directed. 4000 mL 0  . traMADol (ULTRAM) 50 MG tablet Take 50 mg by mouth daily as needed for moderate pain.   0   No current facility-administered medications for this visit.    Allergies:  Patient has no known allergies.   Social History: The patient  reports that she has never smoked. She has never used smokeless tobacco. She reports that she does not drink alcohol or use drugs.   Family History: The patient's family  history includes Alcoholism in her father; Arthritis in her sister; COPD in her sister; Cancer in her father; Heart failure in her mother; Hypertension in her sister; Seizures in her sister.   ROS:  Please see the history of present illness. Otherwise, complete review of systems is positive for none.  All other systems are reviewed and negative.   Physical Exam: VS:  BP 126/78   Pulse 68   Ht 5\' 5"  (1.651 m)   Wt 289 lb (131.1 kg)   SpO2 98%   BMI 48.09 kg/m , BMI Body mass index is 48.09 kg/m.  Wt Readings from Last 3 Encounters:  10/21/16 289 lb (131.1 kg)  09/01/16 290 lb (131.5 kg)  08/08/16 287 lb (130.2 kg)    General: Morbidly  obese woman, appears comfortable at rest. HEENT: Conjunctiva and lids normal, oropharynx clear. Neck: Supple, no elevated JVP or carotid bruits, no thyromegaly. Lungs: Clear to auscultation, nonlabored breathing at rest. Cardiac: Regular rate and rhythm, no S3 or significant systolic murmur, no pericardial rub. Abdomen: Soft, nontender, bowel sounds present, no guarding or rebound. Extremities: No pitting edema, distal pulses 2+. Skin: Warm and dry. Musculoskeletal: No kyphosis. Neuropsychiatric: Alert and oriented x3, affect grossly appropriate.  ECG: I personally reviewed the tracing from 09/01/2016 which showed sinus rhythm with prolonged PR interval and IVCD.  Recent Labwork: 09/01/2016: BUN 9; Creatinine, Ser 0.68; Hemoglobin 11.8; Platelets 208; Potassium 3.2; Sodium 136  January 2017: Cholesterol 193, triglycerides 126, HDL 35, LDL 133  Other Studies Reviewed Today:  Echocardiogram 12/01/2009: Study Conclusions  - Left ventricle: The cavity size was normal. Systolic function was  normal. The estimated ejection fraction was 60%. Wall motion was  normal; there were no regional wall motion abnormalities. Features  are consistent with a pseudonormal left ventricular filling  pattern, with concomitant abnormal relaxation and increased  filling pressure (grade 2 diastolic dysfunction). Doppler  parameters are consistent with high ventricular filling pressure. - Aortic valve: Mildly calcified annulus. Trileaflet. There was very  mild stenosis. Valve area: 2.07cm^2(VTI). Valve area: 2.02cm^2  (Vmax). - Left atrium: The atrium was mildly dilated.  Assessment and Plan:  1. History of syncope as outlined above, no recent events since November. Etiology uncertain, she also describes vertigo at the time. ECG shows prolonged PR interval and IVCD, could have conduction system disease and propensity for bradycardia, although this has not been observed so far and she is not  on any AV nodal blockers. For now I have recommended a 48-hour Holter monitor mainly to assess heart rate variability and also an echocardiogram for structural cardiac assessment.  2. Essential hypertension, on Norvasc and Prinzide.  3. Morbid obesity.  4. Hyperlipidemia by history, LDL 133.  Current medicines were reviewed with the patient today.   Orders Placed This Encounter  Procedures  . Holter monitor - 48 hour  . ECHOCARDIOGRAM COMPLETE    Disposition: Call with test results.  Signed, Satira Sark, MD, [facility] 10/21/2016 1:50 PM    [facility] at [facility] [address] [place], [place] [address] Phone: [phone]; Fax: [phone]

## 2016-10-21 NOTE — Patient Instructions (Signed)
Your physician recommends that you schedule a follow-up appointment in:  To be determined   Your physician has requested that you have an echocardiogram. Echocardiography is a painless test that uses sound waves to create images of your heart. It provides your doctor with information about the size and shape of your heart and how well your heart's chambers and valves are working. This procedure takes approximately one hour. There are no restrictions for this procedure.  Your physician has recommended that you wear a holter monitor for 48 hrs. Holter monitors are medical devices that record the heart's electrical activity. Doctors most often use these monitors to diagnose arrhythmias. Arrhythmias are problems with the speed or rhythm of the heartbeat. The monitor is a small, portable device. You can wear one while you do your normal daily activities. This is usually used to diagnose what is causing palpitations/syncope (passing out).     Your physician recommends that you continue on your current medications as directed. Please refer to the Current Medication list given to you today.    Thank you for choosing [facility] !

## 2016-10-26 ENCOUNTER — Ambulatory Visit (HOSPITAL_COMMUNITY)
Admission: RE | Admit: 2016-10-26 | Discharge: 2016-10-26 | Disposition: A | Discharge status: Home / Self Care | Payer: Medicaid Other | Source: Ambulatory Visit | Attending: Cardiology | Admitting: Cardiology

## 2016-10-26 DIAGNOSIS — E785 Hyperlipidemia, unspecified: Secondary | ICD-10-CM | POA: Diagnosis not present

## 2016-10-26 DIAGNOSIS — R55 Syncope and collapse: Secondary | ICD-10-CM

## 2016-10-26 DIAGNOSIS — I071 Rheumatic tricuspid insufficiency: Secondary | ICD-10-CM | POA: Diagnosis not present

## 2016-10-26 DIAGNOSIS — I4891 Unspecified atrial fibrillation: Secondary | ICD-10-CM | POA: Diagnosis not present

## 2016-10-26 DIAGNOSIS — I10 Essential (primary) hypertension: Secondary | ICD-10-CM | POA: Diagnosis not present

## 2016-10-26 NOTE — Progress Notes (Signed)
*  PRELIMINARY RESULTS* Echocardiogram 2D Echocardiogram has been performed.  Melissa Garner 10/26/2016, 12:09 PM

## 2016-11-04 ENCOUNTER — Encounter: Payer: Self-pay | Admitting: Cardiology

## 2016-11-04 ENCOUNTER — Ambulatory Visit (INDEPENDENT_AMBULATORY_CARE_PROVIDER_SITE_OTHER): Payer: Self-pay | Admitting: Cardiology

## 2016-11-04 VITALS — BP 128/78 | HR 70 | Ht 65.0 in | Wt 284.0 lb

## 2016-11-04 DIAGNOSIS — I350 Nonrheumatic aortic (valve) stenosis: Secondary | ICD-10-CM

## 2016-11-04 DIAGNOSIS — I480 Paroxysmal atrial fibrillation: Secondary | ICD-10-CM

## 2016-11-04 DIAGNOSIS — I10 Essential (primary) hypertension: Secondary | ICD-10-CM

## 2016-11-04 DIAGNOSIS — Z87898 Personal history of other specified conditions: Secondary | ICD-10-CM

## 2016-11-04 NOTE — Progress Notes (Signed)
Cardiology Office Note  Date: 11/04/2016   ID: Melissa Garner, DOB [birth year], MRN [id number]  PCP: Maggie Font, MD  Primary Cardiologist: Rozann Lesches, MD   Chief Complaint  Patient presents with  . History of syncope  . Follow-up cardiac monitor    History of Present Illness: Melissa Garner is a 61 y.o. female seen recently in consultation in late December 2017 for evaluation of syncope. Echocardiogram was obtained revealing preserved LVEF with grade 2 diastolic dysfunction and mild aortic stenosis unlikely to be symptom provoking at this point.  A 48 hour Holter monitor was also obtained to investigate potential conduction system disease. Study showed paroxysmal atrial fibrillation with limited rapid rates and also bradycardia with some pauses around 2.5 seconds. I reviewed the results with her in detail today. She has not had any subsequent syncopal events, including during the time that she will monitor.  CHADSVASC score is 2. Annual risk of stroke on aspirin and is approximately 2.3% versus 0.8% on agent such as Eliquis. We discussed this as well. She was hesitant to initiate an anticoagulant and prefers to start aspirin at this time.  Past Medical History:  Diagnosis Date  . Essential hypertension   . Hyperlipidemia   . Lumbar disc disease    Left leg pain  . Obesity   . Osteoarthritis     Past Surgical History:  Procedure Laterality Date  . ABDOMINAL HYSTERECTOMY     Partial  . CARPAL TUNNEL RELEASE    . COLONOSCOPY N/A 01/14/2016   Procedure: COLONOSCOPY;  Surgeon: Danie Binder, MD;  [facility];  Service: Endoscopy;  Laterality: N/A;  230   . KNEE SURGERY      Current Outpatient Prescriptions  Medication Sig Dispense Refill  . acetaminophen (TYLENOL) 500 MG tablet Take 500 mg by mouth every 6 (six) hours as needed for moderate pain.    Marland Kitchen amLODipine (NORVASC) 10 MG tablet Take 0.5 tablets (5 mg total) by mouth daily. (Patient taking  differently: Take 10 mg by mouth daily. ) 30 tablet 0  . furosemide (LASIX) 20 MG tablet Take 20 mg by mouth 2 (two) times a week. 1 tab on Monday and friday    . ibuprofen (ADVIL,MOTRIN) 800 MG tablet Take 1 tablet (800 mg total) by mouth 3 (three) times daily. 21 tablet 0  . lisinopril-hydrochlorothiazide (PRINZIDE,ZESTORETIC) 20-12.5 MG per tablet Take 1 tablet by mouth daily. 30 tablet 1  . PHENTERMINE HCL PO Take by mouth.    . polyethylene glycol-electrolytes (TRILYTE) 420 g solution Take 4,000 mLs by mouth as directed. 4000 mL 0  . traMADol (ULTRAM) 50 MG tablet Take 50 mg by mouth daily as needed for moderate pain.   0   No current facility-administered medications for this visit.    Allergies:  Patient has no known allergies.   Social History: The patient  reports that she has never smoked. She has never used smokeless tobacco. She reports that she does not drink alcohol or use drugs.   ROS:  Please see the history of present illness. Otherwise, complete review of systems is positive for chronic knee pain and back pain.  All other systems are reviewed and negative.   Physical Exam: VS:  BP 128/78   Pulse 70   Ht 5\' 5"  (1.651 m)   Wt 284 lb (128.8 kg)   SpO2 97%   BMI 47.26 kg/m , BMI Body mass index is 47.26 kg/m.  Wt Readings from Last 3  Encounters:  11/04/16 284 lb (128.8 kg)  10/21/16 289 lb (131.1 kg)  09/01/16 290 lb (131.5 kg)    General: Morbidly obese woman, appears comfortable at rest. HEENT: Conjunctiva and lids normal, oropharynx clear. Neck: Supple, no elevated JVP or carotid bruits, no thyromegaly. Lungs: Clear to auscultation, nonlabored breathing at rest. Cardiac: Regular rate and rhythm, no S3 or significant systolic murmur, no pericardial rub. Abdomen: Soft, nontender, bowel sounds present, no guarding or rebound. Extremities: No pitting edema, distal pulses 2+. Skin: Warm and dry. Musculoskeletal: No kyphosis. Neuropsychiatric: Alert and oriented x3,  affect grossly appropriate.  ECG: I personally reviewed the tracing from 09/01/2016 which showed sinus rhythm with prolonged PR interval and IVCD.  Recent Labwork: 09/01/2016: BUN 9; Creatinine, Ser 0.68; Hemoglobin 11.8; Platelets 208; Potassium 3.2; Sodium 136  Other Studies Reviewed Today:  Echocardiogram 10/26/2016: Study Conclusions  - Left ventricle: The cavity size was normal. Wall thickness was   normal. Systolic function was normal. The estimated ejection   fraction was in the range of 55% to 60%. Features are consistent   with a pseudonormal left ventricular filling pattern, with   concomitant abnormal relaxation and increased filling pressure   (grade 2 diastolic dysfunction). Doppler parameters are   consistent with high ventricular filling pressure. - Aortic valve: Mildly calcified annulus. Trileaflet; mildly   thickened leaflets. There was mild stenosis. Mean gradient (S):   11 mm Hg. Valve area (VTI): 1.58 cm^2. Valve area (Vmax): 1.56   cm^2. Valve area (Vmean): 1.65 cm^2. - Left atrium: The atrium was moderately dilated. - Atrial septum: No defect or patent foramen ovale was identified. - Pulmonary arteries: Systolic pressure was mildly increased. PA   peak pressure: 38 mm Hg (S). - Technically difficult study.  Assessment and Plan:  1. History of syncope, no recurrence recently. Etiology still not certain, but the possibility of symptomatica bradycardia or more prolonged pauses is still to be considered. The recent events documented on cardiac monitoring were not however associated with symptoms. There is no clear indication for pacemaker at this time.  2. Paroxysmal atrial fibrillation incidentally documented on cardiac monitoring. She does not report palpitations. We did discuss stroke risk and prophylaxis with anticoagulation. She was hesitant to use an anticoagulant and will start aspirin at this time. CHADSVASC score is 2.  3. Essential hypertension, blood  pressure is adequately controlled today.  4. Mild aortic stenosis by recent echocardiogram, asymptomatic at this time.  Current medicines were reviewed with the patient today.  Disposition: Follow-up in 3 months.  Signed, Satira Sark, MD, [facility] 11/04/2016 4:29 PM    [facility] [address] [place], [place] [address] Phone: [phone]; Fax: [phone]

## 2016-11-04 NOTE — Patient Instructions (Signed)
Your physician recommends that you schedule a follow-up appointment in: 3 months    Start Aspirin 81 mg daily     Thank you for choosing [facility] !

## 2016-11-07 ENCOUNTER — Ambulatory Visit: Payer: Medicaid Other | Admitting: Orthopedic Surgery

## 2016-11-28 ENCOUNTER — Ambulatory Visit: Payer: 59 | Admitting: Orthopedic Surgery

## 2016-12-05 ENCOUNTER — Ambulatory Visit (INDEPENDENT_AMBULATORY_CARE_PROVIDER_SITE_OTHER): Payer: Medicaid Other

## 2016-12-05 ENCOUNTER — Ambulatory Visit (INDEPENDENT_AMBULATORY_CARE_PROVIDER_SITE_OTHER): Payer: Medicaid Other | Admitting: Orthopedic Surgery

## 2016-12-05 ENCOUNTER — Encounter: Payer: Self-pay | Admitting: Orthopedic Surgery

## 2016-12-05 DIAGNOSIS — M48061 Spinal stenosis, lumbar region without neurogenic claudication: Secondary | ICD-10-CM | POA: Diagnosis not present

## 2016-12-05 DIAGNOSIS — M25561 Pain in right knee: Secondary | ICD-10-CM | POA: Diagnosis not present

## 2016-12-05 DIAGNOSIS — M1711 Unilateral primary osteoarthritis, right knee: Secondary | ICD-10-CM

## 2016-12-05 DIAGNOSIS — G8929 Other chronic pain: Secondary | ICD-10-CM

## 2016-12-05 NOTE — Progress Notes (Signed)
FOLLOW UP VISIT   Patient ID: Melissa Garner, female   DOB: Dec 20, 1955, 61 y.o.   MRN: [id number]  Chief Complaint  Patient presents with  . Follow-up    right knee pain    HPI Melissa Garner is a 61 y.o. female.   HPI  Recheck right knee. Status post left total knee by Dr. Alvan Dame in 2005 complains of some soreness in the anterior part of her left knee with swelling  Here today to really evaluate her right knee. Her BMI remains over 40  She's having increasing right knee pain  Review of Systems Review of Systems  Respiratory: Negative for shortness of breath.   Cardiovascular: Negative for chest pain.   Cardiology has seen her in and evaluated her and found her to not have any restrictions regarding surgery  Physical Exam  Constitutional: She appears well-developed and well-nourished.  Musculoskeletal:  Moderate obesity  Grooming hygiene normal oriented 3 mood pleasant noticeable limp swollen right knee varus alignment range of motion 90   Neurovascular exam right leg normal No instability. Strength normal. Skin normal. No support used for gait at this time      Lewis today show severe varus alignment to this knee. She is in danger of needing a revision type stem and a stemmed implant may be needed anyway. I advised her this advised her that she needs to have surgery done within 6 months despite the BMI over 40. She would like to think about it over 2 months timeframe.  DIAGNOSIS  Encounter Diagnoses  Name Primary?  . Right knee pain, unspecified chronicity   . Morbid obesity due to excess calories ([place])   . Chronic pain of right knee   . Spinal stenosis of lumbar region without neurogenic claudication   . Primary osteoarthritis of right knee Yes     PLAN(RISK)    Patient will call us back in a couple of months, she is disabled because of her back and knee.

## 2016-12-26 ENCOUNTER — Telehealth: Payer: Self-pay | Admitting: Orthopedic Surgery

## 2016-12-26 NOTE — Telephone Encounter (Signed)
Melissa Garner called this morning and stated she is ready to schedule surgery.    Do I need to have her schedule an appointment here in the office first or can you go ahead and schedule this?  Please advise

## 2016-12-26 NOTE — Telephone Encounter (Signed)
Schedule appt?

## 2017-01-17 ENCOUNTER — Encounter: Payer: Self-pay | Admitting: Orthopedic Surgery

## 2017-01-17 ENCOUNTER — Ambulatory Visit: Payer: Medicaid Other | Admitting: Orthopedic Surgery

## 2017-10-31 DIAGNOSIS — J449 Chronic obstructive pulmonary disease, unspecified: Secondary | ICD-10-CM

## 2017-10-31 HISTORY — DX: Chronic obstructive pulmonary disease, unspecified: J44.9

## 2018-01-01 ENCOUNTER — Other Ambulatory Visit: Payer: Self-pay

## 2018-01-01 ENCOUNTER — Emergency Department (HOSPITAL_COMMUNITY)
Admission: EM | Admit: 2018-01-01 | Discharge: 2018-01-01 | Disposition: A | Discharge status: Home / Self Care | Payer: PRIVATE HEALTH INSURANCE | Attending: Emergency Medicine | Admitting: Emergency Medicine

## 2018-01-01 ENCOUNTER — Encounter (HOSPITAL_COMMUNITY): Payer: Self-pay | Admitting: Emergency Medicine

## 2018-01-01 DIAGNOSIS — I10 Essential (primary) hypertension: Secondary | ICD-10-CM | POA: Insufficient documentation

## 2018-01-01 DIAGNOSIS — L304 Erythema intertrigo: Secondary | ICD-10-CM | POA: Insufficient documentation

## 2018-01-01 DIAGNOSIS — Z79899 Other long term (current) drug therapy: Secondary | ICD-10-CM | POA: Insufficient documentation

## 2018-01-01 LAB — CBG MONITORING, ED: GLUCOSE-CAPILLARY: 92 mg/dL (ref 65–99)

## 2018-01-01 MED ORDER — CLOTRIMAZOLE 1 % EX CREA: TOPICAL_CREAM | CUTANEOUS | 0 refills | Status: DC

## 2018-01-01 NOTE — ED Provider Notes (Signed)
[facility] EMERGENCY DEPARTMENT Provider Note   CSN: [id number] Arrival date & time: 01/01/18  2009     History   Chief Complaint Chief Complaint  Patient presents with  . Rash    HPI Melissa Garner is a 62 y.o. female.  She is complaining of rash underneath her breasts and in the folds of her neck that has been going on for about a week.  She has been trying the topical steroid that causes it to burn but then eventually helps a little bit with the staining.  She is also tried topical Neosporin.  She has not had this problem before.  There is no obvious new exposures.  Her main concern is that this could be shingles and she is around young children.  There is been no fever or illness symptoms.  The history is provided by the patient.  Rash   This is a new problem. The current episode started more than 1 week ago. The problem has not changed since onset.The problem is associated with nothing. There has been no fever. The rash is present on the neck and trunk. The pain is at a severity of 0/10. The patient is experiencing no pain. Associated symptoms include itching. Pertinent negatives include no blisters and no weeping. She has tried steriods and antibiotic cream for the symptoms. The treatment provided mild relief.    Past Medical History:  Diagnosis Date  . Essential hypertension   . Hyperlipidemia   . Lumbar disc disease    Left leg pain  . Obesity   . Osteoarthritis     Patient Active Problem List   Diagnosis Date Noted  . Special screening for malignant neoplasms, colon   . Encounter for screening colonoscopy 12/30/2015  . Constipation 12/30/2015  . HYPERLIPIDEMIA-MIXED 11/30/2009  . Unspecified essential hypertension 11/30/2009  . DYSPNEA ON EXERTION 11/30/2009  . MORBID OBESITY 04/30/2009  . KNEE, ARTHRITIS, DEGEN./OSTEO 04/27/2009    Past Surgical History:  Procedure Laterality Date  . ABDOMINAL HYSTERECTOMY     Partial  . CARPAL TUNNEL RELEASE    .  COLONOSCOPY N/A 01/14/2016   Procedure: COLONOSCOPY;  Surgeon: Danie Binder, MD;  [facility];  Service: Endoscopy;  Laterality: N/A;  230   . KNEE SURGERY      OB History    No data available       Home Medications    Prior to Admission medications   Medication Sig Start Date End Date Taking? Authorizing Provider  acetaminophen (TYLENOL) 500 MG tablet Take 500 mg by mouth every 6 (six) hours as needed for moderate pain.    [provider]  amLODipine (NORVASC) 10 MG tablet Take 0.5 tablets (5 mg total) by mouth daily. Patient taking differently: Take 10 mg by mouth daily.  07/12/13   Jola Schmidt, MD  aspirin EC 81 MG tablet Take 81 mg by mouth daily.    [provider]  furosemide (LASIX) 20 MG tablet Take 20 mg by mouth 2 (two) times a week. 1 tab on Monday and friday    [provider]  ibuprofen (ADVIL,MOTRIN) 800 MG tablet Take 1 tablet (800 mg total) by mouth 3 (three) times daily. 07/13/16   Fransico Meadow, PA-C  lisinopril-hydrochlorothiazide (PRINZIDE,ZESTORETIC) 20-12.5 MG per tablet Take 1 tablet by mouth daily. 07/12/13   Jola Schmidt, MD  traMADol (ULTRAM) 50 MG tablet Take 50 mg by mouth daily as needed for moderate pain.  12/23/15   [provider]  Family History Family History  Problem Relation Age of Onset  . Heart failure Mother   . Alcoholism Father   . Cancer Father   . COPD Sister   . Hypertension Sister   . Arthritis Sister   . Seizures Sister   . Colon cancer Neg Hx     Social History Social History   Tobacco Use  . Smoking status: Never Smoker  . Smokeless tobacco: Never Used  Substance Use Topics  . Alcohol use: No    Alcohol/week: 0.0 oz  . Drug use: No     Allergies   Patient has no known allergies.   Review of Systems Review of Systems  Constitutional: Negative for fever.  HENT: Negative for sore throat.   Respiratory: Negative for shortness of breath.   Cardiovascular:  Negative for chest pain.  Gastrointestinal: Negative for abdominal pain.  Genitourinary: Negative for dysuria.  Skin: Positive for itching and rash.     Physical Exam Updated Vital Signs BP 124/67   Pulse 64   Temp 98.1 F (36.7 C)   Resp (!) 21   Ht 5\' 5"  (1.651 m)   Wt 133.8 kg (295 lb)   SpO2 99%   BMI 49.09 kg/m   Physical Exam  Constitutional: She appears well-developed and well-nourished.  HENT:  Head: Normocephalic and atraumatic.  Eyes: Conjunctivae are normal.  Neck: Neck supple.  Cardiovascular: Normal rate and regular rhythm.  Pulmonary/Chest: Effort normal and breath sounds normal.  Abdominal: Soft. Bowel sounds are normal.  Neurological: She is alert. GCS eye subscore is 4. GCS verbal subscore is 5. GCS motor subscore is 6.  Skin: Skin is warm and dry. No ecchymosis, no laceration and no petechiae noted.     Psychiatric: She has a normal mood and affect.     ED Treatments / Results  Labs (all labs ordered are listed, but only abnormal results are displayed) Labs Reviewed  CBG MONITORING, ED    EKG  EKG Interpretation None       Radiology No results found.  Procedures Procedures (including critical care time)  Medications Ordered in ED Medications - No data to display   Initial Impression / Assessment and Plan / ED Course  I have reviewed the triage vital signs and the nursing notes.  Pertinent labs & imaging results that were available during my care of the patient were reviewed by me and considered in my medical decision making (see chart for details).      Final Clinical Impressions(s) / ED Diagnoses   Final diagnoses:  Intertrigo    ED Discharge Orders        Ordered    clotrimazole (LOTRIMIN) 1 % cream     01/01/18 2114       Hayden Rasmussen, MD 01/03/18 [phone]

## 2018-01-01 NOTE — ED Triage Notes (Signed)
Pt has rash under bilateral breast and around her neck x one week.

## 2018-01-01 NOTE — Discharge Instructions (Signed)
Your evaluated in the emergency department for a rash underneath her breasts and on her neck.  This is likely due to this area being moist and a skin fold.  We are prescribing you a topical antifungal because these are often associated with fungal infections.  You should use soap and water and then dry this area very thoroughly.  Baby powder or other such drying powders may help the areas also heal.  You should follow-up with your doctor for further evaluation.

## 2018-10-04 ENCOUNTER — Emergency Department (HOSPITAL_COMMUNITY)
Admission: EM | Admit: 2018-10-04 | Discharge: 2018-10-04 | Disposition: A | Discharge status: Home / Self Care | Payer: Self-pay | Attending: Emergency Medicine | Admitting: Emergency Medicine

## 2018-10-04 ENCOUNTER — Other Ambulatory Visit: Payer: Self-pay

## 2018-10-04 ENCOUNTER — Encounter (HOSPITAL_COMMUNITY): Payer: Self-pay | Admitting: Emergency Medicine

## 2018-10-04 DIAGNOSIS — I10 Essential (primary) hypertension: Secondary | ICD-10-CM | POA: Insufficient documentation

## 2018-10-04 DIAGNOSIS — Z7982 Long term (current) use of aspirin: Secondary | ICD-10-CM | POA: Insufficient documentation

## 2018-10-04 DIAGNOSIS — Z760 Encounter for issue of repeat prescription: Secondary | ICD-10-CM

## 2018-10-04 DIAGNOSIS — Z79899 Other long term (current) drug therapy: Secondary | ICD-10-CM | POA: Insufficient documentation

## 2018-10-04 MED ORDER — AMLODIPINE BESYLATE 10 MG PO TABS: 10.0000 mg | ORAL_TABLET | Freq: Every day | ORAL | 0 refills | Status: DC

## 2018-10-04 MED ORDER — LISINOPRIL-HYDROCHLOROTHIAZIDE 20-12.5 MG PO TABS: 1.0000 | ORAL_TABLET | Freq: Every day | ORAL | 0 refills | Status: DC

## 2018-10-04 NOTE — ED Triage Notes (Signed)
Patient states she is unable to get her BP med refilled until next month.

## 2018-10-04 NOTE — ED Provider Notes (Signed)
[facility] EMERGENCY DEPARTMENT Provider Note   CSN: [id number] Arrival date & time: 10/04/18  1405     History   Chief Complaint Chief Complaint  Patient presents with  . Medication Refill    HPI Melissa Garner is a 62 y.o. female.  HPI   Melissa Garner is a 62 y.o. female who presents to the Emergency Department requesting refill of her blood pressure medications.  She states that she takes amlodipine 10 mg once daily and Zestoretic once daily.  She ran out of her blood pressure medication yesterday.  She complains of slight frontal headache today, but denies any other symptoms.  She describes the headache is minimal.  She states she ran out of her blood pressure medications because her last PCP appointment was rescheduled and she was unable to make the appointment.  She has rescheduled her appointment for January.  She is requesting enough medication until then.  No chest pain, visual changes, dizziness, vomiting or shortness of breath.  Peripheral edema.  Past Medical History:  Diagnosis Date  . Essential hypertension   . Hyperlipidemia   . Lumbar disc disease    Left leg pain  . Obesity   . Osteoarthritis     Patient Active Problem List   Diagnosis Date Noted  . Special screening for malignant neoplasms, colon   . Encounter for screening colonoscopy 12/30/2015  . Constipation 12/30/2015  . HYPERLIPIDEMIA-MIXED 11/30/2009  . Unspecified essential hypertension 11/30/2009  . DYSPNEA ON EXERTION 11/30/2009  . MORBID OBESITY 04/30/2009  . KNEE, ARTHRITIS, DEGEN./OSTEO 04/27/2009    Past Surgical History:  Procedure Laterality Date  . ABDOMINAL HYSTERECTOMY     Partial  . CARPAL TUNNEL RELEASE    . COLONOSCOPY N/A 01/14/2016   Procedure: COLONOSCOPY;  Surgeon: Danie Binder, MD;  Location: [facility];  Service: Endoscopy;  Laterality: N/A;  230   . KNEE SURGERY       OB History    Gravida  3   Para  3   Term  3   Preterm      AB      Living         SAB      TAB      Ectopic      Multiple      Live Births               Home Medications    Prior to Admission medications   Medication Sig Start Date End Date Taking? Authorizing Provider  acetaminophen (TYLENOL) 500 MG tablet Take 500 mg by mouth every 6 (six) hours as needed for moderate pain.    [provider]  amLODipine (NORVASC) 10 MG tablet Take 1 tablet (10 mg total) by mouth daily. 10/04/18   Akeiba Axelson, PA-C  aspirin EC 81 MG tablet Take 81 mg by mouth daily.    [provider]  clotrimazole (LOTRIMIN) 1 % cream Apply to affected area 2 times daily 01/01/18   Hayden Rasmussen, MD  furosemide (LASIX) 20 MG tablet Take 20 mg by mouth 2 (two) times a week. 1 tab on Monday and friday    [provider]  ibuprofen (ADVIL,MOTRIN) 800 MG tablet Take 1 tablet (800 mg total) by mouth 3 (three) times daily. 07/13/16   Fransico Meadow, PA-C  lisinopril-hydrochlorothiazide (PRINZIDE,ZESTORETIC) 20-12.5 MG tablet Take 1 tablet by mouth daily. 10/04/18   Olimpia Tinch, PA-C  traMADol (ULTRAM) 50 MG tablet Take 50 mg by mouth  daily as needed for moderate pain.  12/23/15   [provider]    Family History Family History  Problem Relation Age of Onset  . Heart failure Mother   . Alcoholism Father   . Cancer Father   . COPD Sister   . Hypertension Sister   . Arthritis Sister   . Seizures Sister   . Colon cancer Neg Hx     Social History Social History   Tobacco Use  . Smoking status: Never Smoker  . Smokeless tobacco: Never Used  Substance Use Topics  . Alcohol use: No    Alcohol/week: 0.0 standard drinks  . Drug use: No     Allergies   Patient has no known allergies.   Review of Systems Review of Systems  Constitutional: Negative for chills, fatigue and fever.  Eyes: Negative for visual disturbance.  Respiratory: Negative for cough, chest tightness, shortness of breath and wheezing.   Cardiovascular: Negative  for chest pain and palpitations.  Gastrointestinal: Negative for abdominal pain, nausea and vomiting.  Genitourinary: Negative for dysuria.  Musculoskeletal: Negative for arthralgias, back pain, myalgias, neck pain and neck stiffness.  Skin: Negative for rash.  Neurological: Positive for headaches. Negative for dizziness, syncope, speech difficulty, weakness and numbness.  Hematological: Does not bruise/bleed easily.  Psychiatric/Behavioral: Negative for confusion.     Physical Exam Updated Vital Signs BP (!) 159/96 (BP Location: Right Arm)   Pulse 87   Temp 98.1 F (36.7 C) (Oral)   Resp 12   Ht 5\' 5"  (1.651 m)   Wt 136.1 kg   SpO2 100%   BMI 49.92 kg/m   Physical Exam  Constitutional: She appears well-developed and well-nourished. No distress.  HENT:  Head: Normocephalic and atraumatic.  Mouth/Throat: Oropharynx is clear and moist.  Eyes: Pupils are equal, round, and reactive to light. EOM are normal.  Neck: Normal range of motion. Neck supple.  Cardiovascular: Normal rate, regular rhythm and intact distal pulses.  Pulmonary/Chest: Effort normal and breath sounds normal. No respiratory distress. She exhibits no tenderness.  Abdominal: Soft. She exhibits no distension. There is no tenderness.  Musculoskeletal: Normal range of motion. She exhibits no edema or tenderness.  Lymphadenopathy:    She has no cervical adenopathy.  Neurological: She is alert. No sensory deficit.  CN II-XII grossly intact.  Speech clear.  No pronator drift.  5 out of 5 motor strength of the bilateral upper and lower extremities.  Skin: Skin is warm and dry. Capillary refill takes less than 2 seconds.  Psychiatric: She has a normal mood and affect.  Nursing note and vitals reviewed.    ED Treatments / Results  Labs (all labs ordered are listed, but only abnormal results are displayed) Labs Reviewed - No data to display  EKG None  Radiology No results found.  Procedures Procedures  (including critical care time)  Medications Ordered in ED Medications - No data to display   Initial Impression / Assessment and Plan / ED Course  I have reviewed the triage vital signs and the nursing notes.  Pertinent labs & imaging results that were available during my care of the patient were reviewed by me and considered in my medical decision making (see chart for details).     Patient well-appearing.  Hypertensive today, but ran out of her blood pressure medications yesterday.  Denies symptoms today other than mild headache.  No focal neuro deficits on exam.  No nuchal rigidity.  No motor weakness or peripheral edema.  Patient appropriate for discharge home and agrees to keep her PCP appointment for January.  Return precautions discussed.  Final Clinical Impressions(s) / ED Diagnoses   Final diagnoses:  Encounter for medication refill    ED Discharge Orders         Ordered    amLODipine (NORVASC) 10 MG tablet  Daily     10/04/18 1617    lisinopril-hydrochlorothiazide (PRINZIDE,ZESTORETIC) 20-12.5 MG tablet  Daily     10/04/18 1617           Kem Parkinson, PA-C 10/04/18 1624    Julianne Rice, MD 10/05/18 2327

## 2018-10-04 NOTE — Discharge Instructions (Addendum)
Follow-up with your provider at the health dept.

## 2019-01-22 DIAGNOSIS — I10 Essential (primary) hypertension: Secondary | ICD-10-CM | POA: Diagnosis not present

## 2019-01-22 DIAGNOSIS — M179 Osteoarthritis of knee, unspecified: Secondary | ICD-10-CM | POA: Diagnosis not present

## 2019-01-22 DIAGNOSIS — R7303 Prediabetes: Secondary | ICD-10-CM | POA: Diagnosis not present

## 2019-01-22 DIAGNOSIS — E782 Mixed hyperlipidemia: Secondary | ICD-10-CM | POA: Diagnosis not present

## 2019-01-22 DIAGNOSIS — L83 Acanthosis nigricans: Secondary | ICD-10-CM | POA: Diagnosis not present

## 2019-01-22 DIAGNOSIS — R0602 Shortness of breath: Secondary | ICD-10-CM | POA: Diagnosis not present

## 2019-01-24 ENCOUNTER — Other Ambulatory Visit: Payer: Self-pay | Admitting: Nurse Practitioner

## 2019-01-24 DIAGNOSIS — Z1231 Encounter for screening mammogram for malignant neoplasm of breast: Secondary | ICD-10-CM

## 2019-03-07 DIAGNOSIS — L820 Inflamed seborrheic keratosis: Secondary | ICD-10-CM | POA: Diagnosis not present

## 2019-03-07 DIAGNOSIS — L918 Other hypertrophic disorders of the skin: Secondary | ICD-10-CM | POA: Diagnosis not present

## 2019-03-07 DIAGNOSIS — L308 Other specified dermatitis: Secondary | ICD-10-CM | POA: Diagnosis not present

## 2019-03-20 ENCOUNTER — Ambulatory Visit
Admission: RE | Admit: 2019-03-20 | Discharge: 2019-03-20 | Disposition: A | Discharge status: Home / Self Care | Payer: Medicare Other | Source: Ambulatory Visit | Attending: Nurse Practitioner | Admitting: Nurse Practitioner

## 2019-03-20 ENCOUNTER — Other Ambulatory Visit: Payer: Self-pay

## 2019-03-20 DIAGNOSIS — Z1231 Encounter for screening mammogram for malignant neoplasm of breast: Secondary | ICD-10-CM

## 2019-04-08 DIAGNOSIS — I10 Essential (primary) hypertension: Secondary | ICD-10-CM | POA: Diagnosis not present

## 2019-04-08 DIAGNOSIS — E782 Mixed hyperlipidemia: Secondary | ICD-10-CM | POA: Diagnosis not present

## 2019-04-08 DIAGNOSIS — M545 Low back pain: Secondary | ICD-10-CM | POA: Diagnosis not present

## 2019-04-08 DIAGNOSIS — Z0000 Encounter for general adult medical examination without abnormal findings: Secondary | ICD-10-CM | POA: Diagnosis not present

## 2019-04-08 DIAGNOSIS — M179 Osteoarthritis of knee, unspecified: Secondary | ICD-10-CM | POA: Diagnosis not present

## 2019-05-20 DIAGNOSIS — D529 Folate deficiency anemia, unspecified: Secondary | ICD-10-CM | POA: Diagnosis not present

## 2019-05-20 DIAGNOSIS — R0602 Shortness of breath: Secondary | ICD-10-CM | POA: Diagnosis not present

## 2019-05-20 DIAGNOSIS — I10 Essential (primary) hypertension: Secondary | ICD-10-CM | POA: Diagnosis not present

## 2019-05-20 DIAGNOSIS — R7303 Prediabetes: Secondary | ICD-10-CM | POA: Diagnosis not present

## 2019-05-20 LAB — VITAMIN B12: Vitamin B-12: 322

## 2019-05-20 LAB — TSH: TSH: 1.38 (ref 0.41–5.90)

## 2019-06-24 DIAGNOSIS — Z0189 Encounter for other specified special examinations: Secondary | ICD-10-CM | POA: Diagnosis not present

## 2019-06-26 ENCOUNTER — Encounter (INDEPENDENT_AMBULATORY_CARE_PROVIDER_SITE_OTHER): Payer: Self-pay

## 2019-06-26 ENCOUNTER — Other Ambulatory Visit: Payer: Self-pay

## 2019-06-26 ENCOUNTER — Ambulatory Visit (INDEPENDENT_AMBULATORY_CARE_PROVIDER_SITE_OTHER): Payer: Medicare Other | Admitting: "Endocrinology

## 2019-06-26 ENCOUNTER — Encounter: Payer: Self-pay | Admitting: "Endocrinology

## 2019-06-26 DIAGNOSIS — E212 Other hyperparathyroidism: Secondary | ICD-10-CM | POA: Diagnosis not present

## 2019-06-26 DIAGNOSIS — E210 Primary hyperparathyroidism: Secondary | ICD-10-CM | POA: Insufficient documentation

## 2019-06-26 NOTE — Progress Notes (Signed)
Consult Note       06/26/2019, 5:09 PM  Melissa Garner is a 63 y.o.-year-old female, referred by her  Jordan Hawks, NP  , for evaluation for hypercalcemia/hyperparathyroidism.   Past Medical History:  Diagnosis Date  . Essential hypertension   . Hyperlipidemia   . Lumbar disc disease    Left leg pain  . Obesity   . Osteoarthritis     Past Surgical History:  Procedure Laterality Date  . ABDOMINAL HYSTERECTOMY     Partial  . CARPAL TUNNEL RELEASE    . COLONOSCOPY N/A 01/14/2016   Procedure: COLONOSCOPY;  Surgeon: Danie Binder, MD;  Location: [facility];  Service: Endoscopy;  Laterality: N/A;  230   . KNEE SURGERY      Social History   Tobacco Use  . Smoking status: Never Smoker  . Smokeless tobacco: Never Used  Substance Use Topics  . Alcohol use: No    Alcohol/week: 0.0 standard drinks  . Drug use: No    Family History  Problem Relation Age of Onset  . Heart failure Mother   . Alcoholism Father   . Cancer Father   . COPD Sister   . Hypertension Sister   . Arthritis Sister   . Seizures Sister   . Colon cancer Neg Hx     Outpatient Encounter Medications as of 06/26/2019  Medication Sig  . acetaminophen (TYLENOL) 500 MG tablet Take 500 mg by mouth every 6 (six) hours as needed for moderate pain.  Marland Kitchen amLODipine (NORVASC) 10 MG tablet Take 1 tablet (10 mg total) by mouth daily.  Marland Kitchen aspirin EC 81 MG tablet Take 81 mg by mouth daily.  . clotrimazole (LOTRIMIN) 1 % cream Apply to affected area 2 times daily  . ibuprofen (ADVIL,MOTRIN) 800 MG tablet Take 1 tablet (800 mg total) by mouth 3 (three) times daily.  Marland Kitchen lisinopril-hydrochlorothiazide (PRINZIDE,ZESTORETIC) 20-12.5 MG tablet Take 1 tablet by mouth daily.  . [DISCONTINUED] furosemide (LASIX) 20 MG tablet Take 20 mg by mouth 2 (two) times a week. 1 tab on Monday and friday  . [DISCONTINUED] traMADol (ULTRAM) 50 MG tablet Take 50 mg by mouth daily as  needed for moderate pain.    No facility-administered encounter medications on file as of 06/26/2019.     No Known Allergies   HPI  Melissa Garner was found to have a mild hypocalcemia hypercalcemia of 10.7 in March 2014 but was not diagnosed with parathyroid dysfunction.  She did not require any surgical intervention.  She was recently found to have hypercalcemia of 10.7 and corresponding PTH of 119 on routine labs on July 20,2020.    Patient has no previously known history of parathyroid, pituitary, adrenal dysfunctions; no family history of such dysfunctions. -She has normal renal function.  She did not have any recent bone density.   No prior history of fragility fractures or falls. No history of  kidney stones.  No history of CKD.   she is not on HCTZ or other thiazide therapy.  No history of  vitamin D deficiency.  she is not on calcium supplements,  she eats dairy and green, leafy, vegetables on average  amounts.  she does not have a family history of hypercalcemia, pituitary tumors, thyroid cancer, or osteoporosis.  -Her previsit labs also show normal thyroid function test.   ROS:  Constitutional: no weight gain/loss, no fatigue, no subjective hyperthermia, no subjective hypothermia Eyes: no blurry vision, no xerophthalmia ENT: no sore throat, no nodules palpated in throat, no dysphagia/odynophagia, no hoarseness Cardiovascular: no Chest Pain, no Shortness of Breath, no palpitations, no leg swelling Respiratory: no cough, no shortness of breath  Gastrointestinal: no Nausea/Vomiting/Diarhhea Musculoskeletal: no muscle/joint aches Skin: no rashes Neurological: no tremors, no numbness, no tingling, no dizziness Psychiatric: no depression, no anxiety  PE: BP 140/79   Pulse 89   Ht 5\' 5"  (1.651 m)   Wt (!) 316 lb (143.3 kg)   BMI 52.59 kg/m , Body mass index is 52.59 kg/m. Wt Readings from Last 3 Encounters:  06/26/19 (!) 316 lb (143.3 kg)  10/04/18 300 lb (136.1  kg)  01/01/18 295 lb (133.8 kg)    Constitutional:  + BMI of 52, not in acute distress, normal state of mind Eyes: PERRLA, EOMI, no exophthalmos ENT: moist mucous membranes, no gross thyromegaly, no gross cervical lymphadenopathy Cardiovascular: normal precordial activity, Regular Rate and Rhythm, no Murmur/Rubs/Gallops Respiratory:  adequate breathing efforts, no gross chest deformity, Clear to auscultation bilaterally Gastrointestinal: abdomen soft, Non -tender, No distension, Bowel Sounds present Musculoskeletal: no gross deformities, strength intact in all four extremities Skin: moist, warm, no rashes, + acanthosis nigricans. Neurological: no tremor with outstretched hands, Deep tendon reflexes normal in bilateral lower extremities.     CMP ( most recent) CMP     Component Value Date/Time   NA 136 09/01/2016 0712   K 3.2 (L) 09/01/2016 0712   CL 102 09/01/2016 0712   CO2 29 09/01/2016 0712   GLUCOSE 125 (H) 09/01/2016 0712   BUN 9 09/01/2016 0712   CREATININE 0.68 09/01/2016 0712   CALCIUM 9.9 09/01/2016 0712   PROT 7.7 01/12/2013 1353   ALBUMIN 3.9 01/12/2013 1353   AST 15 01/12/2013 1353   ALT 10 01/12/2013 1353   ALKPHOS 103 01/12/2013 1353   BILITOT 0.4 01/12/2013 1353   GFRNONAA >60 09/01/2016 0712   GFRAA >60 09/01/2016 0712     Lab Results  Component Value Date   TSH 1.716 07/11/2013   FREET4 0.94 07/11/2013    May 20, 2019 PTH 119, calcium 10.7  Assessment: 1. Hypercalcemia / Hyperparathyroidism  Plan: Patient has had at least 2 instances of elevated calcium with the highest being 10.7 with a corresponding elevated PTH of  119.    - No apparent complications from hypercalcemia/hyperparathyroidism: no history of  nephrolithiasis,  osteoporosis,fragility fractures. No abdominal pain, no major mood disorders, no bone pain.  - I discussed with the patient about the physiology of calcium and parathyroid hormone, and possible  effects of  increased PTH/  Calcium , including kidney stones, cardiac dysrhythmias, osteoporosis, abdominal pain, etc.   - The work up so far is not sufficient to reach a conclusion for definitive therapy.  she  needs more studies to confirm and classify the parathyroid dysfunction she may have. I will proceed to obtain  repeat intact PTH/calcium, serum magnesium, serum phosphorus.  It is also essential to obtain 24-hour urine calcium/creatinine to rule out the rare but important cause of mild elevation in calcium and PTH- [place] ( Familial Hypocalciuric Hypercalcemia), which may not require any active intervention.  -If she is confirmed to have hyperparathyroidism, she will be considered for  screening bone density.  -If she is confirmed to have primary hyperparathyroidism, she is a surgical candidate. -She will return in 2 weeks to discuss her new labs as well as 24-hour urine calcium.   - Time spent with the patient: 40 minutes, of which >50% was spent in obtaining information about her symptoms, reviewing her previous labs, evaluations, and treatments, counseling her about her hypercalcemia/hyperparathyroidism, and developing a plan to confirm the diagnosis and long term treatment as necessary.  Please refer to " Patient Self Inventory" in the Media  tab for reviewed elements of pertinent patient history.  Marleene Khalili participated in the discussions, expressed understanding, and voiced agreement with the above plans.  All questions were answered to her satisfaction. she is encouraged to contact clinic should she have any questions or concerns prior to her return visit.  - Return in about 2 weeks (around 07/10/2019) for 24 Hours Urine Calcium and Creatinine, Labs Today- Non-Fasting Ok.   Glade Lloyd, MD [facility] [address] [place], [place] [address] Phone: [phone]  Fax: [phone]    This note was partially dictated with voice recognition software.  Similar sounding words can be transcribed inadequately or may not  be corrected upon review.  06/26/2019, 5:09 PM

## 2019-06-27 LAB — PTH, INTACT AND CALCIUM
Calcium: 10.3 mg/dL (ref 8.6–10.4)
PTH: 165 pg/mL — ABNORMAL HIGH (ref 14–64)

## 2019-06-27 LAB — PHOSPHORUS: Phosphorus: 2.2 mg/dL — ABNORMAL LOW (ref 2.5–4.5)

## 2019-06-27 LAB — MAGNESIUM: Magnesium: 1.7 mg/dL (ref 1.5–2.5)

## 2019-06-29 LAB — CALCIUM, URINE, 24 HOUR: Calcium, 24H Urine: 74 mg/24 h

## 2019-06-29 LAB — CREATININE, URINE, 24 HOUR: Creatinine, 24H Ur: 1.18 g/(24.h) (ref 0.50–2.15)

## 2019-07-04 ENCOUNTER — Emergency Department (HOSPITAL_COMMUNITY)
Admission: EM | Admit: 2019-07-04 | Discharge: 2019-07-04 | Disposition: A | Discharge status: Home / Self Care | Payer: Medicare Other | Attending: Emergency Medicine | Admitting: Emergency Medicine

## 2019-07-04 ENCOUNTER — Encounter (HOSPITAL_COMMUNITY): Payer: Self-pay

## 2019-07-04 ENCOUNTER — Emergency Department (HOSPITAL_COMMUNITY): Payer: Medicare Other

## 2019-07-04 ENCOUNTER — Other Ambulatory Visit: Payer: Self-pay

## 2019-07-04 DIAGNOSIS — E042 Nontoxic multinodular goiter: Secondary | ICD-10-CM | POA: Diagnosis not present

## 2019-07-04 DIAGNOSIS — H539 Unspecified visual disturbance: Secondary | ICD-10-CM | POA: Diagnosis not present

## 2019-07-04 DIAGNOSIS — Z7982 Long term (current) use of aspirin: Secondary | ICD-10-CM | POA: Insufficient documentation

## 2019-07-04 DIAGNOSIS — E041 Nontoxic single thyroid nodule: Secondary | ICD-10-CM

## 2019-07-04 DIAGNOSIS — M542 Cervicalgia: Secondary | ICD-10-CM

## 2019-07-04 DIAGNOSIS — Z79899 Other long term (current) drug therapy: Secondary | ICD-10-CM | POA: Diagnosis not present

## 2019-07-04 DIAGNOSIS — I10 Essential (primary) hypertension: Secondary | ICD-10-CM | POA: Insufficient documentation

## 2019-07-04 DIAGNOSIS — M47812 Spondylosis without myelopathy or radiculopathy, cervical region: Secondary | ICD-10-CM | POA: Diagnosis not present

## 2019-07-04 HISTORY — DX: Disorder of parathyroid gland, unspecified: E21.5

## 2019-07-04 LAB — CBC
HCT: 41.3 % (ref 36.0–46.0)
Hemoglobin: 12.5 g/dL (ref 12.0–15.0)
MCH: 25.8 pg — ABNORMAL LOW (ref 26.0–34.0)
MCHC: 30.3 g/dL (ref 30.0–36.0)
MCV: 85.2 fL (ref 80.0–100.0)
Platelets: 225 10*3/uL (ref 150–400)
RBC: 4.85 MIL/uL (ref 3.87–5.11)
RDW: 13.5 % (ref 11.5–15.5)
WBC: 4.5 10*3/uL (ref 4.0–10.5)
nRBC: 0 % (ref 0.0–0.2)

## 2019-07-04 LAB — BASIC METABOLIC PANEL
Anion gap: 8 (ref 5–15)
BUN: 9 mg/dL (ref 8–23)
CO2: 28 mmol/L (ref 22–32)
Calcium: 10 mg/dL (ref 8.9–10.3)
Chloride: 103 mmol/L (ref 98–111)
Creatinine, Ser: 0.68 mg/dL (ref 0.44–1.00)
GFR calc Af Amer: >60 mL/min (ref >60)
GFR calc non Af Amer: >60 mL/min (ref >60)
Glucose, Bld: 130 mg/dL — ABNORMAL HIGH (ref 70–99)
Potassium: 3.5 mmol/L (ref 3.5–5.1)
Sodium: 139 mmol/L (ref 135–145)

## 2019-07-04 LAB — PROTIME-INR
INR: 1 (ref 0.8–1.2)
Prothrombin Time: 13.1 seconds (ref 11.4–15.2)

## 2019-07-04 MED ORDER — METHOCARBAMOL 500 MG PO TABS: 500.0000 mg | ORAL_TABLET | Freq: Two times a day (BID) | ORAL | 0 refills | Status: DC | PRN

## 2019-07-04 MED ORDER — NAPROXEN 500 MG PO TABS: 500.0000 mg | ORAL_TABLET | Freq: Two times a day (BID) | ORAL | 0 refills | Status: DC

## 2019-07-04 MED ORDER — METHOCARBAMOL 500 MG PO TABS
1000.0000 mg | ORAL_TABLET | Freq: Once | ORAL | Status: AC
  Administered 2019-07-04: 09:00:00 1000 mg via ORAL
  Filled 2019-07-04: qty 2

## 2019-07-04 MED ORDER — IOHEXOL 350 MG/ML SOLN
100.0000 mL | Freq: Once | INTRAVENOUS | Status: AC | PRN
  Administered 2019-07-04: 100 mL via INTRAVENOUS

## 2019-07-04 NOTE — ED Triage Notes (Signed)
Pt c/o pain in r side of neck, worse with movement x 3 days.  Denies injury.  Pt also says she woke up with blurred vision in r eye.  Denies fever.

## 2019-07-04 NOTE — ED Provider Notes (Signed)
[facility] Provider Note   CSN: [id number] Arrival date & time: 07/04/19  0840     History   Chief Complaint Chief Complaint  Patient presents with  . Neck Pain    HPI Melissa Garner is a 63 y.o. female.     HPI  The patient reports several days of neck pain mostly on the right side, seems to be worse with palpation of the neck as well as moving the neck and head from side to side as well as flexion and extension.  She denies fevers or chills, denies numbness or weakness however yesterday she stated she had some right shoulder pain and some tingling in her fingertips which is now gone away.  Unfortunately the patient does feel like she has some blurred vision in her right eye today which is not usual for her.  The neck pain continues despite the use of warm compresses.  This started after waking up a couple of days ago - present on awakening  She has hx of calcium abnormalities - likely due to underlying parathyroid dysfunction.  Past Medical History:  Diagnosis Date  . Essential hypertension   . Hyperlipidemia   . Lumbar disc disease    Left leg pain  . Obesity   . Osteoarthritis   . Parathyroid abnormality [facility])     Patient Active Problem List   Diagnosis Date Noted  . Hypercalcemia 06/26/2019  . Other hyperparathyroidism ([place]) 06/26/2019  . Special screening for malignant neoplasms, colon   . Encounter for screening colonoscopy 12/30/2015  . Constipation 12/30/2015  . HYPERLIPIDEMIA-MIXED 11/30/2009  . Unspecified essential hypertension 11/30/2009  . DYSPNEA ON EXERTION 11/30/2009  . MORBID OBESITY 04/30/2009  . KNEE, ARTHRITIS, DEGEN./OSTEO 04/27/2009    Past Surgical History:  Procedure Laterality Date  . ABDOMINAL HYSTERECTOMY     Partial  . CARPAL TUNNEL RELEASE    . COLONOSCOPY N/A 01/14/2016   Procedure: COLONOSCOPY;  Surgeon: Danie Binder, MD;  [facility];  Service: Endoscopy;  Laterality: N/A;  230   . KNEE  SURGERY       OB History    Gravida  3   Para  3   Term  3   Preterm      AB      Living        SAB      TAB      Ectopic      Multiple      Live Births               Home Medications    Prior to Admission medications   Medication Sig Start Date End Date Taking? Authorizing Provider  albuterol (VENTOLIN HFA) 108 (90 Base) MCG/ACT inhaler Inhale 2 puffs into the lungs daily. 04/08/19  Yes [provider]  amLODipine (NORVASC) 10 MG tablet Take 1 tablet (10 mg total) by mouth daily. 10/04/18  Yes Triplett, Tammy, PA-C  aspirin EC 81 MG tablet Take 81 mg by mouth daily.   Yes [provider]  diclofenac sodium (VOLTAREN) 1 % GEL Apply 1 application topically daily as needed. 01/22/19  Yes [provider]  ibuprofen (ADVIL,MOTRIN) 800 MG tablet Take 1 tablet (800 mg total) by mouth 3 (three) times daily. 07/13/16  Yes Caryl Ada K, PA-C  lisinopril-hydrochlorothiazide (PRINZIDE,ZESTORETIC) 20-12.5 MG tablet Take 1 tablet by mouth daily. 10/04/18  Yes Triplett, Tammy, PA-C  triamcinolone cream (KENALOG) 0.1 % Apply 1 application topically daily as needed.  04/12/19  Yes [provider]  methocarbamol (ROBAXIN) 500 MG tablet Take 1 tablet (500 mg total) by mouth 2 (two) times daily as needed for muscle spasms. 07/04/19   Noemi Chapel, MD  naproxen (NAPROSYN) 500 MG tablet Take 1 tablet (500 mg total) by mouth 2 (two) times daily with a meal. 07/04/19   Noemi Chapel, MD    Family History Family History  Problem Relation Age of Onset  . Heart failure Mother   . Alcoholism Father   . Cancer Father   . COPD Sister   . Hypertension Sister   . Arthritis Sister   . Seizures Sister   . Colon cancer Neg Hx     Social History Social History   Tobacco Use  . Smoking status: Never Smoker  . Smokeless tobacco: Never Used  Substance Use Topics  . Alcohol use: No    Alcohol/week: 0.0 standard drinks  . Drug use: No     Allergies    Patient has no known allergies.   Review of Systems Review of Systems  Constitutional: Negative for fever.  HENT: Negative for ear pain, facial swelling, hearing loss, sore throat and voice change.   Eyes: Positive for visual disturbance.  Respiratory: Positive for shortness of breath ( has chronic SOB). Negative for cough.   Cardiovascular: Negative for chest pain and palpitations.  Gastrointestinal: Negative for nausea and vomiting.  Musculoskeletal: Positive for neck pain and neck stiffness. Negative for gait problem and joint swelling.  Skin: Negative for rash.  Neurological: Negative for weakness and headaches.  Hematological: Does not bruise/bleed easily.     Physical Exam Updated Vital Signs BP 122/88 (BP Location: Right Arm)   Pulse 64   Temp 98.3 F (36.8 C) (Oral)   Resp 15   Ht 1.651 m (5\' 5" )   Wt (!) 143 kg   SpO2 100%   BMI 52.46 kg/m   Physical Exam Vitals signs and nursing note reviewed.  Constitutional:      General: She is not in acute distress.    Appearance: She is well-developed.  HENT:     Head: Normocephalic and atraumatic.     Mouth/Throat:     Mouth: Mucous membranes are moist.     Pharynx: No oropharyngeal exudate.  Eyes:     General: No scleral icterus.       Right eye: No discharge.        Left eye: No discharge.     Extraocular Movements: Extraocular movements intact.     Conjunctiva/sclera: Conjunctivae normal.     Pupils: Pupils are equal, round, and reactive to light.  Neck:     Musculoskeletal: Normal range of motion and neck supple.     Thyroid: No thyromegaly.     Vascular: No JVD.     Comments: There is tenderness over the right lateral neck, there is no bruit, no pulsating mass, there is tenderness with any range of motion Cardiovascular:     Rate and Rhythm: Normal rate and regular rhythm.     Heart sounds: Normal heart sounds. No murmur. No friction rub. No gallop.   Pulmonary:     Effort: Pulmonary effort is normal. No  respiratory distress.     Breath sounds: Normal breath sounds. No wheezing or rales.     Comments: Dyspneic when she walks but not at rest (chronic) Abdominal:     General: Bowel sounds are normal. There is no distension.     Palpations: Abdomen is soft.  There is no mass.     Tenderness: There is no abdominal tenderness.  Musculoskeletal: Normal range of motion.        General: No tenderness.  Lymphadenopathy:     Cervical: No cervical adenopathy.  Skin:    General: Skin is warm and dry.     Findings: No erythema or rash.  Neurological:     Mental Status: She is alert.     Coordination: Coordination normal.     Comments: The patient states that the vision in her right eye is blurry compared to the left.  There is no obvious cranial nerve deficits otherwise, normal strength and sensation in all 4 extremities, normal finger-nose-finger, no pronator drift  Psychiatric:        Behavior: Behavior normal.      ED Treatments / Results  Labs (all labs ordered are listed, but only abnormal results are displayed) Labs Reviewed  BASIC METABOLIC PANEL - Abnormal; Notable for the following components:      Result Value   Glucose, Bld 130 (*)    All other components within normal limits  CBC - Abnormal; Notable for the following components:   MCH 25.8 (*)    All other components within normal limits  PROTIME-INR    EKG None  Radiology Ct Angio Neck W And/or Wo Contrast  Result Date: 07/04/2019 CLINICAL DATA:  Right neck pain with right vision change. EXAM: CT ANGIOGRAPHY NECK TECHNIQUE: Multidetector CT imaging of the neck was performed using the standard protocol during bolus administration of intravenous contrast. Multiplanar CT image reconstructions and MIPs were obtained to evaluate the vascular anatomy. Carotid stenosis measurements (when applicable) are obtained utilizing NASCET criteria, using the distal internal carotid diameter as the denominator. CONTRAST:  126mL OMNIPAQUE  IOHEXOL 350 MG/ML SOLN COMPARISON:  None. FINDINGS: Aortic arch: Standard branching. Imaged portion shows no evidence of aneurysm or dissection. No significant stenosis of the major arch vessel origins. Right carotid system: Right carotid widely patent. Negative for atherosclerotic disease or dissection Left carotid system: Left carotid widely patent. Negative for atherosclerotic disease or dissection. Vertebral arteries: Both vertebral arteries widely patent without stenosis or dissection. Skeleton: Cervical spondylosis.  No acute skeletal abnormality. Other neck: Multiple thyroid nodules. Largest thyroid nodule measures 22 x 33 mm and may need to be biopsied. Multiple areas of calcification in the thyroid bilaterally. No adenopathy in the neck. Upper chest: Lung apices clear bilaterally. IMPRESSION: 1. Negative for carotid or vertebral dissection. No significant arterial stenosis or aneurysm. 2. Multiple thyroid nodules. 22 x 33 mm thyroid nodule on the left may need to be biopsy. Recommend thyroid ultrasound. Electronically Signed   By: Franchot Gallo M.D.   On: 07/04/2019 11:34    Procedures Procedures (including critical care time)  Medications Ordered in ED Medications  methocarbamol (ROBAXIN) tablet 1,000 mg (1,000 mg Oral Given 07/04/19 0917)  iohexol (OMNIPAQUE) 350 MG/ML injection 100 mL (100 mLs Intravenous Contrast Given 07/04/19 1050)     Initial Impression / Assessment and Plan / ED Course  I have reviewed the triage vital signs and the nursing notes.  Pertinent labs & imaging results that were available during my care of the patient were reviewed by me and considered in my medical decision making (see chart for details).  Clinical Course as of Jul 03 1157  Thu Jul 04, 2019  1154 The CT scan based on my interpretation shows no signs of dissection or aneurysm, I agree with the radiologist that there does appear  to be normal vascular anatomy however there is the appearance of multiple  thyroid nodules, the patient will be informed of this.    [BM]  1155 Given the patient's rather unremarkable CT scan with regards to her neck she will be treated for musculoskeletal pain.  She is agreeable.   [BM]    Clinical Course User Index [BM] Noemi Chapel, MD      With visual changes and neck pain I am concerned about carotid disection, CTA ordered - robaxin for muscle relaxant.  Hot compress, pt agreeable.  Seems muscular - r/o vascular cause.  Final Clinical Impressions(s) / ED Diagnoses   Final diagnoses:  Neck pain on right side  Thyroid nodule    ED Discharge Orders         Ordered    methocarbamol (ROBAXIN) 500 MG tablet  2 times daily PRN     07/04/19 1156    naproxen (NAPROSYN) 500 MG tablet  2 times daily with meals     07/04/19 1156           Noemi Chapel, MD 07/04/19 1158

## 2019-07-04 NOTE — Discharge Instructions (Signed)
Your CT scan did not show any specific abnormalities of your neck You do not have any signs of aneurysms of your neck You do have evidence of a thyroid nodule, this will need to be followed up with an ultrasound, please tell your doctor about this this week to schedule an ultrasound Please take Naprosyn twice daily and Robaxin twice daily as needed for the pain in your neck, continue the warm compresses. This type of pain often last for a week or 2 before totally goes away. Seek medical exam for increasing or severe or worsening symptoms Please talk to your eye doctor about the progressive vision changes in your right eye.

## 2019-07-12 ENCOUNTER — Encounter: Payer: Self-pay | Admitting: "Endocrinology

## 2019-07-12 ENCOUNTER — Ambulatory Visit (INDEPENDENT_AMBULATORY_CARE_PROVIDER_SITE_OTHER): Payer: Medicare Other | Admitting: "Endocrinology

## 2019-07-12 ENCOUNTER — Other Ambulatory Visit: Payer: Self-pay

## 2019-07-12 DIAGNOSIS — E042 Nontoxic multinodular goiter: Secondary | ICD-10-CM | POA: Diagnosis not present

## 2019-07-12 NOTE — Progress Notes (Signed)
07/12/2019                                                                             Endocrinology Telehealth Visit Follow up Note -During COVID -19 Pandemic  I connected with Melissa Garner on 07/12/2019   by telephone and verified that I am speaking with the correct person using two identifiers. [place], 04-15-1956. she has verbally consented to this visit. All issues noted in this document were discussed and addressed. The format was not optimal for physical exam.       Melissa Garner is a 63 y.o.-year-old female, referred by her  PCP  Jordan Hawks, NP  , for evaluation for hypercalcemia/hyperparathyroidism.  She is being engaged in telehealth for follow-up with repeat labs.   Past Medical History:  Diagnosis Date  . Essential hypertension   . Hyperlipidemia   . Lumbar disc disease    Left leg pain  . Obesity   . Osteoarthritis   . Parathyroid abnormality [facility])     Past Surgical History:  Procedure Laterality Date  . ABDOMINAL HYSTERECTOMY     Partial  . CARPAL TUNNEL RELEASE    . COLONOSCOPY N/A 01/14/2016   Procedure: COLONOSCOPY;  Surgeon: Danie Binder, MD;  [facility];  Service: Endoscopy;  Laterality: N/A;  230   . KNEE SURGERY      Social History   Tobacco Use  . Smoking status: Never Smoker  . Smokeless tobacco: Never Used  Substance Use Topics  . Alcohol use: No    Alcohol/week: 0.0 standard drinks  . Drug use: No    Family History  Problem Relation Age of Onset  . Heart failure Mother   . Alcoholism Father   . Cancer Father   . COPD Sister   . Hypertension Sister   . Arthritis Sister   . Seizures Sister   . Colon cancer Neg Hx     Outpatient Encounter Medications as of 07/12/2019  Medication Sig  . albuterol (VENTOLIN HFA) 108 (90 Base) MCG/ACT inhaler Inhale 2 puffs into the lungs daily.  Marland Kitchen amLODipine (NORVASC) 10 MG tablet Take 1 tablet (10 mg total) by mouth daily.  Marland Kitchen aspirin EC 81 MG tablet Take 81 mg by  mouth daily.  . diclofenac sodium (VOLTAREN) 1 % GEL Apply 1 application topically daily as needed.  Marland Kitchen ibuprofen (ADVIL,MOTRIN) 800 MG tablet Take 1 tablet (800 mg total) by mouth 3 (three) times daily.  Marland Kitchen lisinopril-hydrochlorothiazide (PRINZIDE,ZESTORETIC) 20-12.5 MG tablet Take 1 tablet by mouth daily.  . methocarbamol (ROBAXIN) 500 MG tablet Take 1 tablet (500 mg total) by mouth 2 (two) times daily as needed for muscle spasms.  . naproxen (NAPROSYN) 500 MG tablet Take 1 tablet (500 mg total) by mouth 2 (two) times daily with a meal.  . triamcinolone cream (KENALOG) 0.1 % Apply 1 application topically daily as needed.   No facility-administered encounter medications on file as of 07/12/2019.     No Known Allergies   HPI  Melissa Garner was found to have a mild hypocalcemia hypercalcemia of 10.7 in March 2014 but was not diagnosed with parathyroid dysfunction.  She did not require any surgical intervention.  She  was recently found to have hypercalcemia of 10.7 and corresponding PTH of 119 on routine labs on July 20,2020.    Her most recent labs show calcium normal at 10.0, 24-hour urine calcium is 74.  Patient has no previously known history of parathyroid, pituitary, adrenal dysfunctions; no family history of such dysfunctions. -She has normal renal function.  She did not have any recent bone density.   No prior history of fragility fractures or falls. No history of  kidney stones.  No history of CKD.   she is not on HCTZ or other thiazide therapy.  No history of  vitamin D deficiency.  she is not on calcium supplements,  she eats dairy and green, leafy, vegetables on average amounts.  she does not have a family history of hypercalcemia, pituitary tumors, thyroid cancer, or osteoporosis.  -She has no new complaints today.  During recent trip to ER she underwent CT scan of head and neck which revealed multiple thyroid nodules.   ROS: Limited as above.  PE: There were no  vitals taken for this visit., There is no height or weight on file to calculate BMI. Wt Readings from Last 3 Encounters:  07/04/19 (!) 315 lb 4.1 oz (143 kg)  06/26/19 (!) 316 lb (143.3 kg)  10/04/18 300 lb (136.1 kg)     CMP ( most recent) CMP     Component Value Date/Time   NA 139 07/04/2019 0905   K 3.5 07/04/2019 0905   CL 103 07/04/2019 0905   CO2 28 07/04/2019 0905   GLUCOSE 130 (H) 07/04/2019 0905   BUN 9 07/04/2019 0905   CREATININE 0.68 07/04/2019 0905   CALCIUM 10.0 07/04/2019 0905   PROT 7.7 01/12/2013 1353   ALBUMIN 3.9 01/12/2013 1353   AST 15 01/12/2013 1353   ALT 10 01/12/2013 1353   ALKPHOS 103 01/12/2013 1353   BILITOT 0.4 01/12/2013 1353   GFRNONAA >60 07/04/2019 0905   GFRAA >60 07/04/2019 0905     Lab Results  Component Value Date   TSH 1.38 05/20/2019   TSH 1.716 07/11/2013   FREET4 0.94 07/11/2013    May 20, 2019 PTH 119, calcium 10.7  Assessment: 1. Hypercalcemia / Hyperparathyroidism 2.  Multinodular goiter  Plan: Patient has had at least 2 instances of elevated calcium with the highest being 10.7 with a corresponding elevated PTH of  119.    - No apparent complications from hypercalcemia/hyperparathyroidism: no history of  nephrolithiasis,  osteoporosis,fragility fractures. No abdominal pain, no major mood disorders, no bone pain.  -Her most recent labs show normal calcium of 10 associated with normal 24-hour urine calcium of 74.  She is not a surgical candidate at this time.  She will be continued on observation only.  She is advised to avoid any over-the-counter calcium supplements.  She will need PTH/calcium and DEXA scan in 6 months. -Regarding her recently discovered multinodular goiter, she will need more dedicated thyroid ultrasound to characterize her goiter. This study will be scheduled to be obtained as soon as possible, and will be discussed in 2 weeks with her on her phone visit.   Time for this visit: 15 minutes. Dshanti Karren  participated in the discussions, expressed understanding, and voiced agreement with the above plans.  All questions were answered to her satisfaction. she is encouraged to contact clinic should she have any questions or concerns prior to her return visit.  - Return in about 2 weeks (around 07/26/2019) for Thyroid / Neck Ultrasound.  Glade Lloyd, MD [facility] [address] [place], [place] [address] Phone: [phone]  Fax: [phone]    This note was partially dictated with voice recognition software. Similar sounding words can be transcribed inadequately or may not  be corrected upon review.  07/12/2019, 1:17 PM

## 2019-07-25 ENCOUNTER — Ambulatory Visit (HOSPITAL_COMMUNITY)
Admission: RE | Admit: 2019-07-25 | Discharge: 2019-07-25 | Disposition: A | Discharge status: Home / Self Care | Payer: Medicare Other | Source: Ambulatory Visit | Attending: "Endocrinology | Admitting: "Endocrinology

## 2019-07-25 ENCOUNTER — Other Ambulatory Visit: Payer: Self-pay

## 2019-07-25 DIAGNOSIS — E042 Nontoxic multinodular goiter: Secondary | ICD-10-CM | POA: Insufficient documentation

## 2019-07-26 ENCOUNTER — Ambulatory Visit (INDEPENDENT_AMBULATORY_CARE_PROVIDER_SITE_OTHER): Payer: Medicare Other | Admitting: "Endocrinology

## 2019-07-26 ENCOUNTER — Encounter: Payer: Self-pay | Admitting: "Endocrinology

## 2019-07-26 DIAGNOSIS — E212 Other hyperparathyroidism: Secondary | ICD-10-CM | POA: Diagnosis not present

## 2019-07-26 DIAGNOSIS — E042 Nontoxic multinodular goiter: Secondary | ICD-10-CM

## 2019-07-26 NOTE — Progress Notes (Signed)
07/26/2019                                                                             Endocrinology Telehealth Visit Follow up Note -During COVID -19 Pandemic  I connected with Melissa Garner on 07/26/2019   by telephone and verified that I am speaking with the correct person using two identifiers. [name], [birth year]. she has verbally consented to this visit. All issues noted in this document were discussed and addressed. The format was not optimal for physical exam.       Melissa Garner is a 63 y.o.-year-old female, referred by her  PCP  Jordan Hawks, NP , for evaluation for hypercalcemia/hyperparathyroidism.  She is being engaged in telehealth for follow-up with repeat labs.   Past Medical History:  Diagnosis Date  . Essential hypertension   . Hyperlipidemia   . Lumbar disc disease    Left leg pain  . Obesity   . Osteoarthritis   . Parathyroid abnormality [facility])     Past Surgical History:  Procedure Laterality Date  . ABDOMINAL HYSTERECTOMY     Partial  . CARPAL TUNNEL RELEASE    . COLONOSCOPY N/A 01/14/2016   Procedure: COLONOSCOPY;  Surgeon: Danie Binder, MD;  [facility];  Service: Endoscopy;  Laterality: N/A;  230   . KNEE SURGERY      Social History   Tobacco Use  . Smoking status: Never Smoker  . Smokeless tobacco: Never Used  Substance Use Topics  . Alcohol use: No    Alcohol/week: 0.0 standard drinks  . Drug use: No    Family History  Problem Relation Age of Onset  . Heart failure Mother   . Alcoholism Father   . Cancer Father   . COPD Sister   . Hypertension Sister   . Arthritis Sister   . Seizures Sister   . Colon cancer Neg Hx     Outpatient Encounter Medications as of 07/26/2019  Medication Sig  . albuterol (VENTOLIN HFA) 108 (90 Base) MCG/ACT inhaler Inhale 2 puffs into the lungs daily.  Marland Kitchen amLODipine (NORVASC) 10 MG tablet Take 1 tablet (10 mg total) by mouth daily.  Marland Kitchen aspirin EC 81 MG tablet Take 81 mg by  mouth daily.  . diclofenac sodium (VOLTAREN) 1 % GEL Apply 1 application topically daily as needed.  Marland Kitchen ibuprofen (ADVIL,MOTRIN) 800 MG tablet Take 1 tablet (800 mg total) by mouth 3 (three) times daily.  Marland Kitchen lisinopril-hydrochlorothiazide (PRINZIDE,ZESTORETIC) 20-12.5 MG tablet Take 1 tablet by mouth daily.  . methocarbamol (ROBAXIN) 500 MG tablet Take 1 tablet (500 mg total) by mouth 2 (two) times daily as needed for muscle spasms.  . naproxen (NAPROSYN) 500 MG tablet Take 1 tablet (500 mg total) by mouth 2 (two) times daily with a meal.  . triamcinolone cream (KENALOG) 0.1 % Apply 1 application topically daily as needed.   No facility-administered encounter medications on file as of 07/26/2019.     No Known Allergies   HPI  Melissa Garner was found to have a mild hypocalcemia hypercalcemia of 10.7 in March 2014 but was not diagnosed with parathyroid dysfunction.  She did not require any surgical intervention.  She was  recently found to have hypercalcemia of 10.7 and corresponding PTH of 119 on routine labs on July 20,2020.    Her most recent labs show calcium normal at 10.0, 24-hour urine calcium is 74. She is currently on observation only regarding her hypercalcemia.   Patient has no previously known history of parathyroid, pituitary, adrenal dysfunctions; no family history of such dysfunctions. -She has normal renal function.  She did not have any recent bone density.  No prior history of fragility fractures or falls. No history of  kidney stones.  No history of CKD.   she is not on HCTZ or other thiazide therapy.  No history of  vitamin D deficiency.  she is not on calcium supplements,  she eats dairy and green, leafy, vegetables on average amounts.  she does not have a family history of hypercalcemia, pituitary tumors, thyroid cancer, or osteoporosis.   During recent trip to ER she underwent CT scan of head and neck which revealed multiple thyroid nodules. She was sent for  dedicated thyroid ultrasound.  She underwent thyroid ultrasound which confirms multiple nodules including 2.5 cm suspicious nodule in the left lobe. -She does not have any new complaints since last visit.   ROS: Limited as above.  PE: There were no vitals taken for this visit., There is no height or weight on file to calculate BMI. Wt Readings from Last 3 Encounters:  07/04/19 (!) 315 lb 4.1 oz (143 kg)  06/26/19 (!) 316 lb (143.3 kg)  10/04/18 300 lb (136.1 kg)     CMP ( most recent) CMP     Component Value Date/Time   NA 139 07/04/2019 0905   K 3.5 07/04/2019 0905   CL 103 07/04/2019 0905   CO2 28 07/04/2019 0905   GLUCOSE 130 (H) 07/04/2019 0905   BUN 9 07/04/2019 0905   CREATININE 0.68 07/04/2019 0905   CALCIUM 10.0 07/04/2019 0905   PROT 7.7 01/12/2013 1353   ALBUMIN 3.9 01/12/2013 1353   AST 15 01/12/2013 1353   ALT 10 01/12/2013 1353   ALKPHOS 103 01/12/2013 1353   BILITOT 0.4 01/12/2013 1353   GFRNONAA >60 07/04/2019 0905   GFRAA >60 07/04/2019 0905     Lab Results  Component Value Date   TSH 1.38 05/20/2019   TSH 1.716 07/11/2013   FREET4 0.94 07/11/2013    May 20, 2019 PTH 119, calcium 10.7  Assessment: 1. Hypercalcemia / Hyperparathyroidism 2.  Multinodular goiter  Plan: Patient has had at least 2 instances of elevated calcium with the highest being 10.7 with a corresponding elevated PTH of  119.    - No apparent complications from hypercalcemia/hyperparathyroidism: no history of  nephrolithiasis,  osteoporosis,fragility fractures. No abdominal pain, no major mood disorders, no bone pain.  -Her most recent labs show normal calcium of 10 associated with normal 24-hour urine calcium of 74.  She is not a surgical candidate at this time.  She will be continued on observation only.  She is advised to avoid any over-the-counter calcium supplements.  She will need PTH/calcium and DEXA scan in 6 months. -Regarding her multinodular goiter confirmed by  ultrasound, she is approached for fine-needle aspiration of largest nodules on bilateral thyroid lobes including the 2.5 cm nodule on the left lobe.  This procedure will be scheduled to be done in [facility] and patient will return with her biopsy results in 2 weeks.     This study will be scheduled to be obtained as soon as possible, and will  be discussed in 2 weeks with her on her phone visit.   Time for this visit: 15 minutes. Shirita Esson  participated in the discussions, expressed understanding, and voiced agreement with the above plans.  All questions were answered to her satisfaction. she is encouraged to contact clinic should she have any questions or concerns prior to her return visit.  - Return in about 2 weeks (around 08/09/2019) for Follow up with Biopsy Results.   Glade Lloyd, MD [facility] [address] [place], [place] [address] Phone: [phone]  Fax: [phone]    This note was partially dictated with voice recognition software. Similar sounding words can be transcribed inadequately or may not  be corrected upon review.  07/26/2019, 11:58 AM

## 2019-08-09 ENCOUNTER — Ambulatory Visit: Payer: Medicare Other | Admitting: "Endocrinology

## 2019-08-14 ENCOUNTER — Encounter (HOSPITAL_COMMUNITY): Payer: Self-pay

## 2019-08-14 ENCOUNTER — Ambulatory Visit (HOSPITAL_COMMUNITY)
Admission: RE | Admit: 2019-08-14 | Discharge: 2019-08-14 | Disposition: A | Discharge status: Home / Self Care | Payer: Medicare Other | Source: Ambulatory Visit | Attending: "Endocrinology | Admitting: "Endocrinology

## 2019-08-14 ENCOUNTER — Other Ambulatory Visit: Payer: Self-pay

## 2019-08-14 DIAGNOSIS — E041 Nontoxic single thyroid nodule: Secondary | ICD-10-CM | POA: Diagnosis not present

## 2019-08-14 DIAGNOSIS — E042 Nontoxic multinodular goiter: Secondary | ICD-10-CM | POA: Diagnosis not present

## 2019-08-14 DIAGNOSIS — J181 Lobar pneumonia, unspecified organism: Secondary | ICD-10-CM | POA: Diagnosis not present

## 2019-08-14 MED ORDER — LIDOCAINE HCL (PF) 2 % IJ SOLN
INTRAMUSCULAR | Status: AC
  Filled 2019-08-14: qty 10

## 2019-08-16 LAB — CYTOLOGY - NON PAP

## 2019-08-19 ENCOUNTER — Ambulatory Visit (INDEPENDENT_AMBULATORY_CARE_PROVIDER_SITE_OTHER): Payer: Medicare Other | Admitting: "Endocrinology

## 2019-08-19 ENCOUNTER — Other Ambulatory Visit: Payer: Self-pay

## 2019-08-19 ENCOUNTER — Encounter: Payer: Self-pay | Admitting: "Endocrinology

## 2019-08-19 DIAGNOSIS — E042 Nontoxic multinodular goiter: Secondary | ICD-10-CM | POA: Diagnosis not present

## 2019-08-19 DIAGNOSIS — E212 Other hyperparathyroidism: Secondary | ICD-10-CM

## 2019-08-19 NOTE — Progress Notes (Signed)
08/19/2019                                 Endocrinology Telehealth Visit Follow up Note -During COVID -19 Pandemic  I connected with Melissa Garner on 08/19/2019   by telephone and verified that I am speaking with the correct person using two identifiers. Robinson, 25-Jul-1956. she has verbally consented to this visit. All issues noted in this document were discussed and addressed. The format was not optimal for physical exam.       Melissa Garner is a 63 y.o.-year-old female, referred by her  PCP  Jordan Hawks, NP , for evaluation for hypercalcemia/hyperparathyroidism.  She is being engaged in telehealth for follow-up with repeat labs.   Past Medical History:  Diagnosis Date  . Essential hypertension   . Hyperlipidemia   . Lumbar disc disease    Left leg pain  . Obesity   . Osteoarthritis   . Parathyroid abnormality [facility])     Past Surgical History:  Procedure Laterality Date  . ABDOMINAL HYSTERECTOMY     Partial  . CARPAL TUNNEL RELEASE    . COLONOSCOPY N/A 01/14/2016   Procedure: COLONOSCOPY;  Surgeon: Danie Binder, MD;  [facility];  Service: Endoscopy;  Laterality: N/A;  230   . KNEE SURGERY      Social History   Tobacco Use  . Smoking status: Never Smoker  . Smokeless tobacco: Never Used  Substance Use Topics  . Alcohol use: No    Alcohol/week: 0.0 standard drinks  . Drug use: No    Family History  Problem Relation Age of Onset  . Heart failure Mother   . Alcoholism Father   . Cancer Father   . COPD Sister   . Hypertension Sister   . Arthritis Sister   . Seizures Sister   . Colon cancer Neg Hx     Outpatient Encounter Medications as of 08/19/2019  Medication Sig  . albuterol (VENTOLIN HFA) 108 (90 Base) MCG/ACT inhaler Inhale 2 puffs into the lungs daily.  Marland Kitchen amLODipine (NORVASC) 10 MG tablet Take 1 tablet (10 mg total) by mouth daily.  Marland Kitchen aspirin EC 81 MG tablet Take 81 mg by mouth daily.  . diclofenac sodium  (VOLTAREN) 1 % GEL Apply 1 application topically daily as needed.  Marland Kitchen ibuprofen (ADVIL,MOTRIN) 800 MG tablet Take 1 tablet (800 mg total) by mouth 3 (three) times daily.  Marland Kitchen lisinopril-hydrochlorothiazide (PRINZIDE,ZESTORETIC) 20-12.5 MG tablet Take 1 tablet by mouth daily.  . methocarbamol (ROBAXIN) 500 MG tablet Take 1 tablet (500 mg total) by mouth 2 (two) times daily as needed for muscle spasms.  . naproxen (NAPROSYN) 500 MG tablet Take 1 tablet (500 mg total) by mouth 2 (two) times daily with a meal.  . triamcinolone cream (KENALOG) 0.1 % Apply 1 application topically daily as needed.   No facility-administered encounter medications on file as of 08/19/2019.     No Known Allergies   HPI  Melissa Garner was found to have a mild hypocalcemia hypercalcemia of 10.7 in March 2014 but was not diagnosed with parathyroid dysfunction.  She did not require any surgical intervention.  She was recently found to have hypercalcemia of 10.7 and corresponding PTH of 119 on routine labs on July 20,2020.    Her most recent labs show calcium normal at 10.0, 24-hour urine calcium is 74. She is currently on observation only regarding her hypercalcemia.  Patient has no previously known history of parathyroid, pituitary, adrenal dysfunctions; no family history of such dysfunctions. -She has normal renal function.  She did not have any recent bone density.  No prior history of fragility fractures or falls. No history of  kidney stones.  No history of CKD.   she is not on HCTZ or other thiazide therapy.  No history of  vitamin D deficiency.  she is not on calcium supplements,  she eats dairy and green, leafy, vegetables on average amounts.  she does not have a family history of hypercalcemia, pituitary tumors, thyroid cancer, or osteoporosis.   During recent trip to ER she underwent CT scan of head and neck which revealed multiple thyroid nodules.  She was sent for dedicated thyroid ultrasound.  She  underwent thyroid ultrasound which confirms multiple nodules including 2.5 cm suspicious nodule in the left lobe.  Biopsy of the suspicious nodule before this visit was benign. -She does not have any new complaints since last visit.   ROS: Limited as above.  PE: There were no vitals taken for this visit., There is no height or weight on file to calculate BMI. Wt Readings from Last 3 Encounters:  07/04/19 (!) 315 lb 4.1 oz (143 kg)  06/26/19 (!) 316 lb (143.3 kg)  10/04/18 300 lb (136.1 kg)     CMP ( most recent) CMP     Component Value Date/Time   NA 139 07/04/2019 0905   K 3.5 07/04/2019 0905   CL 103 07/04/2019 0905   CO2 28 07/04/2019 0905   GLUCOSE 130 (H) 07/04/2019 0905   BUN 9 07/04/2019 0905   CREATININE 0.68 07/04/2019 0905   CALCIUM 10.0 07/04/2019 0905   PROT 7.7 01/12/2013 1353   ALBUMIN 3.9 01/12/2013 1353   AST 15 01/12/2013 1353   ALT 10 01/12/2013 1353   ALKPHOS 103 01/12/2013 1353   BILITOT 0.4 01/12/2013 1353   GFRNONAA >60 07/04/2019 0905   GFRAA >60 07/04/2019 0905     Lab Results  Component Value Date   TSH 1.38 05/20/2019   TSH 1.716 07/11/2013   FREET4 0.94 07/11/2013    May 20, 2019 PTH 119, calcium 10.7  Assessment: 1. Hypercalcemia / Hyperparathyroidism 2.  Multinodular goiter-benign FNA.  Plan: Patient has had at least 2 instances of elevated calcium with the highest being 10.7 with a corresponding elevated PTH of  119.    - No apparent complications from hypercalcemia/hyperparathyroidism: no history of  nephrolithiasis,  osteoporosis,fragility fractures. No abdominal pain, no major mood disorders, no bone pain.  -Her most recent labs show normal calcium of 10 associated with normal 24-hour urine calcium of 74.  She is not a surgical candidate at this time.  She will be continued on observation only.  She is advised to avoid any over-the-counter calcium supplements.   She will need PTH/calcium, DEXA scan before her next visit in 6  months.    -Regarding her multinodular goiter confirmed by ultrasound-the biopsy results are benign, does not need any intervention at this time.     Time for this visit: 15 minutes. Melissa Garner  participated in the discussions, expressed understanding, and voiced agreement with the above plans.  All questions were answered to her satisfaction. she is encouraged to contact clinic should she have any questions or concerns prior to her return visit.   - Return in about 6 months (around 02/17/2020) for Follow up with Pre-visit Labs, Follow up with Bone Density.   Glade Lloyd, MD  [facility] [address] [place], [place] [address] Phone: [phone]  Fax: [phone]    This note was partially dictated with voice recognition software. Similar sounding words can be transcribed inadequately or may not  be corrected upon review.  08/19/2019, 4:23 PM

## 2019-09-04 DIAGNOSIS — E782 Mixed hyperlipidemia: Secondary | ICD-10-CM | POA: Diagnosis not present

## 2019-09-04 DIAGNOSIS — Z7189 Other specified counseling: Secondary | ICD-10-CM | POA: Diagnosis not present

## 2019-09-04 DIAGNOSIS — I10 Essential (primary) hypertension: Secondary | ICD-10-CM | POA: Diagnosis not present

## 2019-12-30 DIAGNOSIS — E785 Hyperlipidemia, unspecified: Secondary | ICD-10-CM | POA: Diagnosis not present

## 2019-12-30 DIAGNOSIS — I10 Essential (primary) hypertension: Secondary | ICD-10-CM | POA: Diagnosis not present

## 2020-02-19 ENCOUNTER — Ambulatory Visit: Payer: Medicare Other | Admitting: "Endocrinology

## 2020-02-28 DIAGNOSIS — M1711 Unilateral primary osteoarthritis, right knee: Secondary | ICD-10-CM | POA: Diagnosis not present

## 2020-02-28 DIAGNOSIS — E785 Hyperlipidemia, unspecified: Secondary | ICD-10-CM | POA: Diagnosis not present

## 2020-02-28 DIAGNOSIS — I10 Essential (primary) hypertension: Secondary | ICD-10-CM | POA: Diagnosis not present

## 2020-04-20 DIAGNOSIS — R7303 Prediabetes: Secondary | ICD-10-CM | POA: Diagnosis not present

## 2020-04-20 DIAGNOSIS — M1710 Unilateral primary osteoarthritis, unspecified knee: Secondary | ICD-10-CM | POA: Diagnosis not present

## 2020-04-20 DIAGNOSIS — R7302 Impaired glucose tolerance (oral): Secondary | ICD-10-CM | POA: Diagnosis not present

## 2020-04-20 DIAGNOSIS — E785 Hyperlipidemia, unspecified: Secondary | ICD-10-CM | POA: Diagnosis not present

## 2020-04-20 DIAGNOSIS — I10 Essential (primary) hypertension: Secondary | ICD-10-CM | POA: Diagnosis not present

## 2020-04-21 DIAGNOSIS — Z1231 Encounter for screening mammogram for malignant neoplasm of breast: Secondary | ICD-10-CM | POA: Diagnosis not present

## 2020-05-20 DIAGNOSIS — I10 Essential (primary) hypertension: Secondary | ICD-10-CM | POA: Diagnosis not present

## 2020-05-20 DIAGNOSIS — R2233 Localized swelling, mass and lump, upper limb, bilateral: Secondary | ICD-10-CM | POA: Diagnosis not present

## 2020-05-20 DIAGNOSIS — M180 Bilateral primary osteoarthritis of first carpometacarpal joints: Secondary | ICD-10-CM | POA: Diagnosis not present

## 2020-06-02 ENCOUNTER — Ambulatory Visit (INDEPENDENT_AMBULATORY_CARE_PROVIDER_SITE_OTHER): Payer: Medicare Other | Admitting: Adult Health

## 2020-06-02 ENCOUNTER — Encounter: Payer: Self-pay | Admitting: Adult Health

## 2020-06-02 VITALS — BP 130/50 | HR 86 | Ht 66.0 in | Wt 304.0 lb

## 2020-06-02 DIAGNOSIS — N816 Rectocele: Secondary | ICD-10-CM

## 2020-06-02 DIAGNOSIS — Z01419 Encounter for gynecological examination (general) (routine) without abnormal findings: Secondary | ICD-10-CM

## 2020-06-02 DIAGNOSIS — Z1211 Encounter for screening for malignant neoplasm of colon: Secondary | ICD-10-CM | POA: Diagnosis not present

## 2020-06-02 LAB — HEMOCCULT GUIAC POC 1CARD (OFFICE): Fecal Occult Blood, POC: NEGATIVE

## 2020-06-02 NOTE — Progress Notes (Signed)
Patient ID: Melissa Garner, female   DOB: 19-Oct-1956, 64 y.o.   MRN: [id number] History of Present Illness: Melissa Garner is a 64 year old black female, widowed, sp hysterectomy, in for a well woman gyn exam. PCP is Jordan Hawks NP   Current Medications, Allergies, Past Medical History, Past Surgical History, Family History and Social History were reviewed in [facility] record.     Review of Systems: Patient denies any headaches, hearing loss, fatigue, blurred vision, shortness of breath, chest pain, abdominal pain, problems with bowel movements(constipated at times, uses MOM), urination, or intercourse(not active). No joint pain or mood swings. Has back and knee issues and knots on top of both hands L>R.   Physical Exam:BP (!) 130/50 (BP Location: Left Wrist, Patient Position: Sitting, Cuff Size: Normal)    Pulse 86    Ht 5\' 6"  (1.676 m)    Wt (!) 304 lb (137.9 kg)    BMI 49.07 kg/m  General:  Well developed, well nourished, no acute distress,walking with cane today  Skin:  Warm and dry Neck:  Midline trachea, enlarged thyroid,(has known goiter) good ROM, no lymphadenopathy,no carotid bruits heard Lungs; Clear to auscultation bilaterally Breast:  No dominant palpable mass, retraction, or nipple discharge,large and heavy Cardiovascular: Regular rate and rhythm Abdomen:  Soft, non tender, no hepatosplenomegaly,obese Pelvic:  External genitalia is normal in appearance, no lesions.  The vagina has well healed cuff, loss of color,moisture and rugae. Urethra has no lesions or masses. The cervix and uterus are absent. No adnexal masses or tenderness noted.Bladder is non tender, no masses felt. Rectal: Good sphincter tone, no polyps, or hemorrhoids felt.  Hemoccult negative.+low rectocele.  Extremities/musculoskeletal:  No swelling or varicosities noted, no clubbing or cyanosis Psych:  No mood changes, alert and cooperative,seems happy AA is 0  Fall risk is low PHQ 9 score  is 0  [facility] - 06/02/20 1104      Pregnancy Intention Screening   Does the patient want to become pregnant in the next year? N/A    Does the patient's partner want to become pregnant in the next year? N/A    Would the patient like to discuss contraceptive options today? N/A      Contraception Wrap Up   Contraception Counseling Provided No           Impression and Plan: 1. Encounter for well woman exam with routine gynecological exam Physical in 2 years Mammogram yearly Colonoscopy per GI Labs with PCP   2. Encounter for screening fecal occult blood testing   3. Rectocele

## 2020-06-03 DIAGNOSIS — Z0000 Encounter for general adult medical examination without abnormal findings: Secondary | ICD-10-CM | POA: Diagnosis not present

## 2020-06-03 DIAGNOSIS — E785 Hyperlipidemia, unspecified: Secondary | ICD-10-CM | POA: Diagnosis not present

## 2020-06-03 DIAGNOSIS — R7303 Prediabetes: Secondary | ICD-10-CM | POA: Diagnosis not present

## 2020-06-03 DIAGNOSIS — I10 Essential (primary) hypertension: Secondary | ICD-10-CM | POA: Diagnosis not present

## 2020-06-12 DIAGNOSIS — M069 Rheumatoid arthritis, unspecified: Secondary | ICD-10-CM | POA: Diagnosis not present

## 2020-06-12 DIAGNOSIS — M67442 Ganglion, left hand: Secondary | ICD-10-CM | POA: Diagnosis not present

## 2020-06-12 DIAGNOSIS — M65842 Other synovitis and tenosynovitis, left hand: Secondary | ICD-10-CM | POA: Diagnosis not present

## 2020-06-12 DIAGNOSIS — M65841 Other synovitis and tenosynovitis, right hand: Secondary | ICD-10-CM | POA: Diagnosis not present

## 2020-06-12 DIAGNOSIS — R2233 Localized swelling, mass and lump, upper limb, bilateral: Secondary | ICD-10-CM | POA: Diagnosis not present

## 2020-06-18 DIAGNOSIS — L308 Other specified dermatitis: Secondary | ICD-10-CM | POA: Diagnosis not present

## 2020-06-19 DIAGNOSIS — R2233 Localized swelling, mass and lump, upper limb, bilateral: Secondary | ICD-10-CM | POA: Diagnosis not present

## 2020-06-19 DIAGNOSIS — M180 Bilateral primary osteoarthritis of first carpometacarpal joints: Secondary | ICD-10-CM | POA: Diagnosis not present

## 2020-06-30 DIAGNOSIS — M1711 Unilateral primary osteoarthritis, right knee: Secondary | ICD-10-CM | POA: Diagnosis not present

## 2020-06-30 DIAGNOSIS — I10 Essential (primary) hypertension: Secondary | ICD-10-CM | POA: Diagnosis not present

## 2020-06-30 DIAGNOSIS — E785 Hyperlipidemia, unspecified: Secondary | ICD-10-CM | POA: Diagnosis not present

## 2020-07-29 DIAGNOSIS — L308 Other specified dermatitis: Secondary | ICD-10-CM | POA: Diagnosis not present

## 2020-07-31 ENCOUNTER — Telehealth: Payer: Self-pay | Admitting: "Endocrinology

## 2020-07-31 NOTE — Telephone Encounter (Signed)
Those in the order will do. PTH/Ca, TSH, Free T4, and Bone Density

## 2020-07-31 NOTE — Telephone Encounter (Signed)
Please advise, I see that a bone density was ordered. I also see that the patient was a no show earlier in the year, Would you like to order anything else?

## 2020-07-31 NOTE — Telephone Encounter (Signed)
Patient called and said she seen dr nida last year and he wanted to do a repeat of thyroid US this year to see if thyroid lesions have grown. Please contact pt.

## 2020-08-01 DIAGNOSIS — I10 Essential (primary) hypertension: Secondary | ICD-10-CM | POA: Diagnosis not present

## 2020-08-01 DIAGNOSIS — E782 Mixed hyperlipidemia: Secondary | ICD-10-CM | POA: Diagnosis not present

## 2020-08-01 DIAGNOSIS — Z7189 Other specified counseling: Secondary | ICD-10-CM | POA: Diagnosis not present

## 2020-08-03 ENCOUNTER — Other Ambulatory Visit: Payer: Self-pay

## 2020-08-03 DIAGNOSIS — E212 Other hyperparathyroidism: Secondary | ICD-10-CM

## 2020-08-03 DIAGNOSIS — E042 Nontoxic multinodular goiter: Secondary | ICD-10-CM

## 2020-08-03 NOTE — Telephone Encounter (Signed)
Patient returned call, Lab orders updated for [facility] bone density scheduled for 08/10/20 @ 11AM for [facility], Patient is aware and verbalized understanding.

## 2020-08-03 NOTE — Telephone Encounter (Signed)
Returned call to pt to find out what lab she is using and to see if she wanted to contact [facility] to schedule Bone density herself. Had to leave a voicemail as she did not answer.

## 2020-08-05 DIAGNOSIS — E042 Nontoxic multinodular goiter: Secondary | ICD-10-CM | POA: Diagnosis not present

## 2020-08-05 DIAGNOSIS — E212 Other hyperparathyroidism: Secondary | ICD-10-CM | POA: Diagnosis not present

## 2020-08-06 LAB — T4, FREE: Free T4: 1 ng/dL (ref 0.8–1.8)

## 2020-08-06 LAB — TSH: TSH: 1.45 mIU/L (ref 0.40–4.50)

## 2020-08-06 LAB — PTH, INTACT AND CALCIUM
Calcium: 10.8 mg/dL — ABNORMAL HIGH (ref 8.6–10.4)
PTH: 175 pg/mL — ABNORMAL HIGH (ref 14–64)

## 2020-08-10 ENCOUNTER — Other Ambulatory Visit: Payer: Self-pay

## 2020-08-10 ENCOUNTER — Ambulatory Visit (HOSPITAL_COMMUNITY)
Admission: RE | Admit: 2020-08-10 | Discharge: 2020-08-10 | Disposition: A | Discharge status: Home / Self Care | Payer: Medicare Other | Source: Ambulatory Visit | Attending: "Endocrinology | Admitting: "Endocrinology

## 2020-08-10 DIAGNOSIS — Z780 Asymptomatic menopausal state: Secondary | ICD-10-CM | POA: Insufficient documentation

## 2020-08-10 DIAGNOSIS — Z13820 Encounter for screening for osteoporosis: Secondary | ICD-10-CM | POA: Insufficient documentation

## 2020-08-10 DIAGNOSIS — E213 Hyperparathyroidism, unspecified: Secondary | ICD-10-CM | POA: Diagnosis not present

## 2020-08-10 DIAGNOSIS — E212 Other hyperparathyroidism: Secondary | ICD-10-CM | POA: Diagnosis not present

## 2020-08-19 ENCOUNTER — Encounter: Payer: Self-pay | Admitting: "Endocrinology

## 2020-08-19 ENCOUNTER — Other Ambulatory Visit: Payer: Self-pay

## 2020-08-19 ENCOUNTER — Ambulatory Visit (INDEPENDENT_AMBULATORY_CARE_PROVIDER_SITE_OTHER): Payer: Medicare Other | Admitting: "Endocrinology

## 2020-08-19 VITALS — BP 116/80 | HR 76 | Ht 66.0 in | Wt 295.4 lb

## 2020-08-19 DIAGNOSIS — E042 Nontoxic multinodular goiter: Secondary | ICD-10-CM | POA: Diagnosis not present

## 2020-08-19 DIAGNOSIS — E212 Other hyperparathyroidism: Secondary | ICD-10-CM

## 2020-08-19 NOTE — [place] Notes (Signed)
08/19/2020      Endocrinology follow-up note        Melissa Garner is a 64 y.o.-year-old female, referred by her  PCP  Jordan Hawks, NP , for evaluation for hypercalcemia/hyperparathyroidism.  She is being engaged in telehealth for follow-up with repeat labs.   Past Medical History:  Diagnosis Date  . Essential hypertension   . Hyperlipidemia   . Lumbar disc disease    Left leg pain  . Obesity   . Osteoarthritis   . Parathyroid abnormality [facility])     Past Surgical History:  Procedure Laterality Date  . ABDOMINAL HYSTERECTOMY     Partial  . CARPAL TUNNEL RELEASE    . COLONOSCOPY N/A 01/14/2016   Procedure: COLONOSCOPY;  Surgeon: Danie Binder, MD;  [facility];  Service: Endoscopy;  Laterality: N/A;  230   . KNEE SURGERY      Social History   Tobacco Use  . Smoking status: Never Smoker  . Smokeless tobacco: Never Used  Vaping Use  . Vaping Use: Never used  Substance Use Topics  . Alcohol use: No    Alcohol/week: 0.0 standard drinks  . Drug use: No    Family History  Problem Relation Age of Onset  . Heart failure Mother   . Alcoholism Father   . Cancer Father   . COPD Sister   . Hypertension Sister   . Arthritis Sister   . Seizures Sister   . Hypertension Brother   . Arthritis Brother   . Hypertension Brother   . Arthritis Brother   . Hypertension Brother   . Arthritis Brother   . Hypertension Sister   . Hypertension Sister   . COPD Sister   . Hypertension Daughter   . Hypertension Son   . Crohn's disease Son   . Colon cancer Neg Hx     Outpatient Encounter Medications as of 08/19/2020  Medication Sig  . acetaminophen (TYLENOL) 325 MG tablet Take by mouth.  Marland Kitchen albuterol (VENTOLIN HFA) 108 (90 Base) MCG/ACT inhaler Inhale 2 puffs into the lungs daily.  Marland Kitchen amLODipine (NORVASC) 10 MG tablet Take 1 tablet (10 mg total) by mouth daily.  Marland Kitchen atorvastatin (LIPITOR) 20 MG tablet Take 20 mg by mouth daily.  . clobetasol cream (TEMOVATE)  0.05 % Apply topically.  . diclofenac sodium (VOLTAREN) 1 % GEL Apply 1 application topically daily as needed.  Marland Kitchen ibuprofen (ADVIL,MOTRIN) 800 MG tablet Take 1 tablet (800 mg total) by mouth 3 (three) times daily.  Marland Kitchen lisinopril-hydrochlorothiazide (PRINZIDE,ZESTORETIC) 20-12.5 MG tablet Take 1 tablet by mouth daily.  . [DISCONTINUED] celecoxib (CELEBREX) 200 MG capsule Take by mouth.  . [DISCONTINUED] phentermine 37.5 MG capsule TAKE ONE TABLET BY MOUTH 30 MINUTES BEFORE 1ST FOOD OF THE DAY  . [DISCONTINUED] triamcinolone cream (KENALOG) 0.1 % Apply 1 application topically daily as needed.   No facility-administered encounter medications on file as of 08/19/2020.    No Known Allergies   HPI  Melissa Garner is returning for follow-up after she was seen in consultation for hypercalcemia associated with elevated PTH level.   She continues to have elevated PTH of 175, calcium of 10.8 increasing from 10 during her last visit.  She is currently on observation only regarding her hypercalcemia.   Patient has no previously known history of parathyroid, pituitary, adrenal dysfunctions; no family history of such dysfunctions. -She has normal renal function.  She did not have any recent bone density.  24-hour urine calcium in August 2020  was 74.  No prior history of fragility fractures or falls. No history of  kidney stones.  No history of CKD.   she is not on HCTZ or other thiazide therapy.  No history of  vitamin D deficiency.  she is not on calcium supplements,  she eats dairy and green, leafy, vegetables on average amounts.  she does not have a family history of hypercalcemia, pituitary tumors, thyroid cancer, or osteoporosis.   During recent trip to ER she underwent CT scan of head and neck which revealed multiple thyroid nodules.  She was sent for dedicated thyroid ultrasound.  She underwent thyroid ultrasound which confirms multiple nodules including 2.5 cm suspicious nodule in the  left lobe.  Biopsy of the suspicious nodule before this visit was benign. -She does not have any new complaints since last visit.   ROS: Limited as above.  PE: BP 116/80   Pulse 76   Ht 5\' 6"  (1.676 m)   Wt 295 lb 6.4 oz (134 kg)   BMI 47.68 kg/m , Body mass index is 47.68 kg/m. Wt Readings from Last 3 Encounters:  08/19/20 295 lb 6.4 oz (134 kg)  06/02/20 (!) 304 lb (137.9 kg)  07/04/19 (!) 315 lb 4.1 oz (143 kg)     CMP ( most recent) CMP     Component Value Date/Time   NA 139 07/04/2019 0905   K 3.5 07/04/2019 0905   CL 103 07/04/2019 0905   CO2 28 07/04/2019 0905   GLUCOSE 130 (H) 07/04/2019 0905   BUN 9 07/04/2019 0905   CREATININE 0.68 07/04/2019 0905   CALCIUM 10.8 (H) 08/05/2020 1325   PROT 7.7 01/12/2013 1353   ALBUMIN 3.9 01/12/2013 1353   AST 15 01/12/2013 1353   ALT 10 01/12/2013 1353   ALKPHOS 103 01/12/2013 1353   BILITOT 0.4 01/12/2013 1353   GFRNONAA >60 07/04/2019 0905   GFRAA >60 07/04/2019 0905     Lab Results  Component Value Date   TSH 1.45 08/05/2020   TSH 1.38 05/20/2019   TSH 1.716 07/11/2013   FREET4 1.0 08/05/2020   FREET4 0.94 07/11/2013    May 20, 2019 PTH 119, calcium 10.7  Assessment: 1. Hypercalcemia / Hyperparathyroidism 2.  Multinodular goiter-benign FNA.  Plan: Patient has had several instances of elevated calcium with the highest being 10.8 with a corresponding PTH of 175.  Her 24-hour urine calcium was low normal at 74 last year.  This could still be early , mild primary hyperparathyroidism.  Her bone density is normal.  - No apparent complications from hypercalcemia/hyperparathyroidism: no history of  nephrolithiasis,  osteoporosis,fragility fractures. No abdominal pain, no major mood disorders, no bone pain.  -She will not be considered for surgery at this time, with plan to repeat PTH/calcium and 24-hour urine and office visit in 6 months.     She will be continued on observation only until then.  She is advised  to avoid any over-the-counter calcium supplements.      -Regarding her multinodular goiter confirmed by ultrasound-the biopsy results are benign, does not need any intervention at this time.  She will have repeat thyroid function test along with her next labs.  She is advised to maintain close follow-up with her PCP.    - Time spent on this patient care encounter:  20 minutes of which 50% was spent in  counseling and the rest reviewing  her current and  previous labs / studies and medications  doses and developing a plan for  long term care. Aylana Hirschfeld  participated in the discussions, expressed understanding, and voiced agreement with the above plans.  All questions were answered to her satisfaction. she is encouraged to contact clinic should she have any questions or concerns prior to her return visit.    - Return in about 6 months (around 02/17/2021) for F/U with Pre-visit Labs, Martin Lake, MD [facility] [address] [place], [place] [address] Phone: [phone]  Fax: [phone]    This note was partially dictated with voice recognition software. Similar sounding words can be transcribed inadequately or may not  be corrected upon review.  08/19/2020, 4:08 PM

## 2020-08-29 DIAGNOSIS — E785 Hyperlipidemia, unspecified: Secondary | ICD-10-CM | POA: Diagnosis not present

## 2020-08-29 DIAGNOSIS — M1711 Unilateral primary osteoarthritis, right knee: Secondary | ICD-10-CM | POA: Diagnosis not present

## 2020-08-29 DIAGNOSIS — I10 Essential (primary) hypertension: Secondary | ICD-10-CM | POA: Diagnosis not present

## 2020-09-01 DIAGNOSIS — I10 Essential (primary) hypertension: Secondary | ICD-10-CM | POA: Diagnosis not present

## 2020-09-01 DIAGNOSIS — Z7189 Other specified counseling: Secondary | ICD-10-CM | POA: Diagnosis not present

## 2020-09-21 IMAGING — MG DIGITAL SCREENING BILATERAL MAMMOGRAM WITH TOMO AND CAD
8 series · 8 of 24 positions shown · non-contrast
Comparison: Previous exam(s).

CLINICAL DATA: Screening.

EXAM:
DIGITAL SCREENING BILATERAL MAMMOGRAM WITH TOMO AND CAD

[L CC synth-2D]
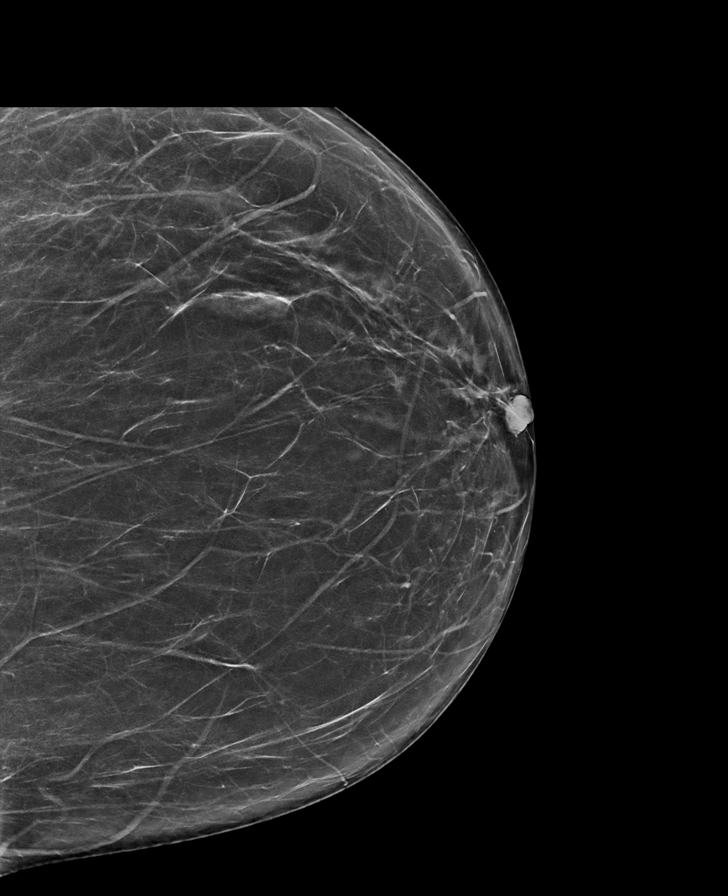

[R MLO synth-2D]
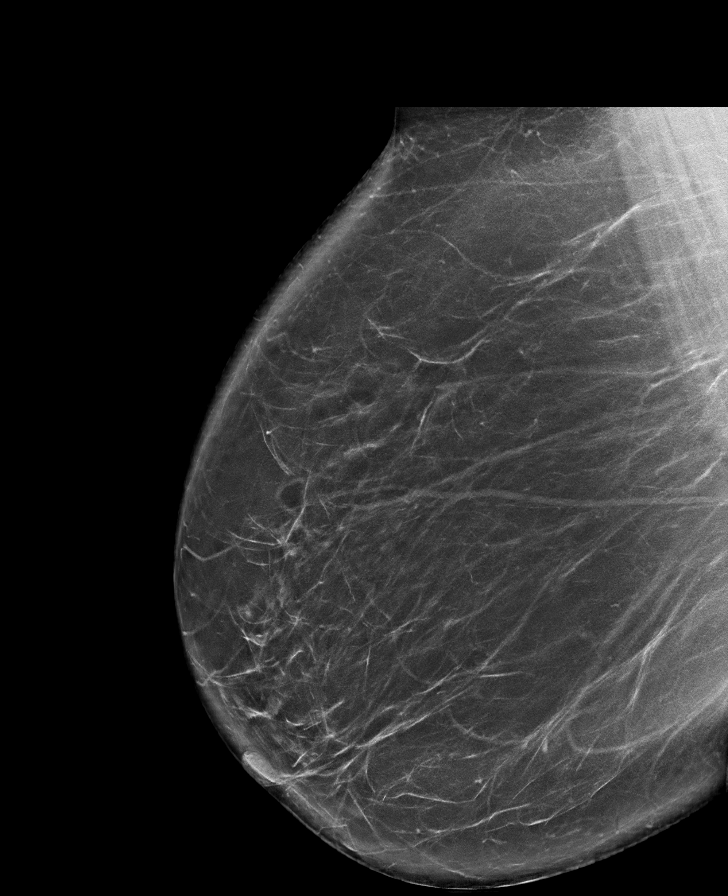

[L MLO synth-2D]
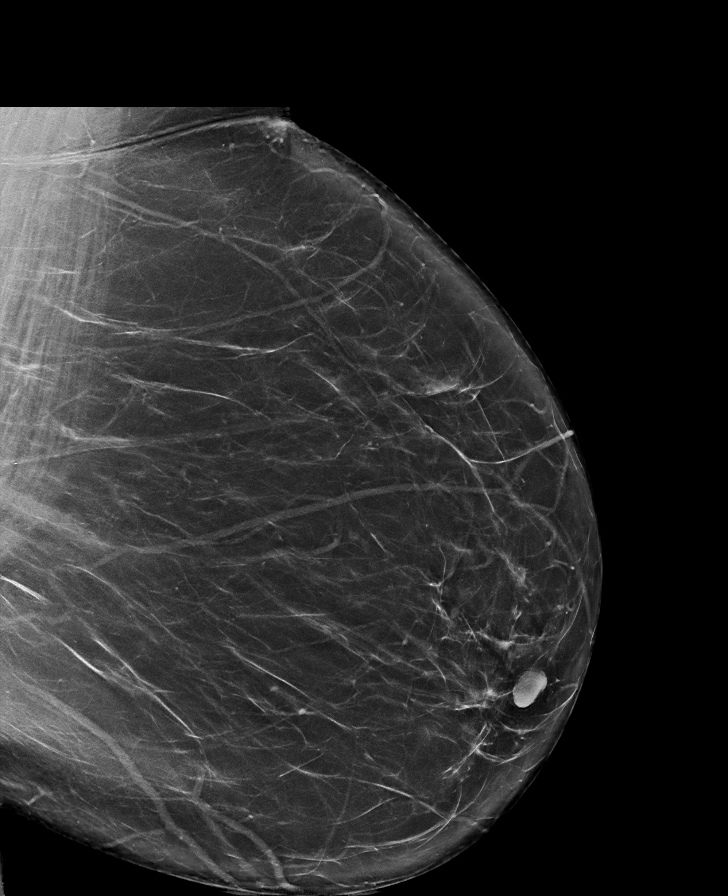

[R CC synth-2D]
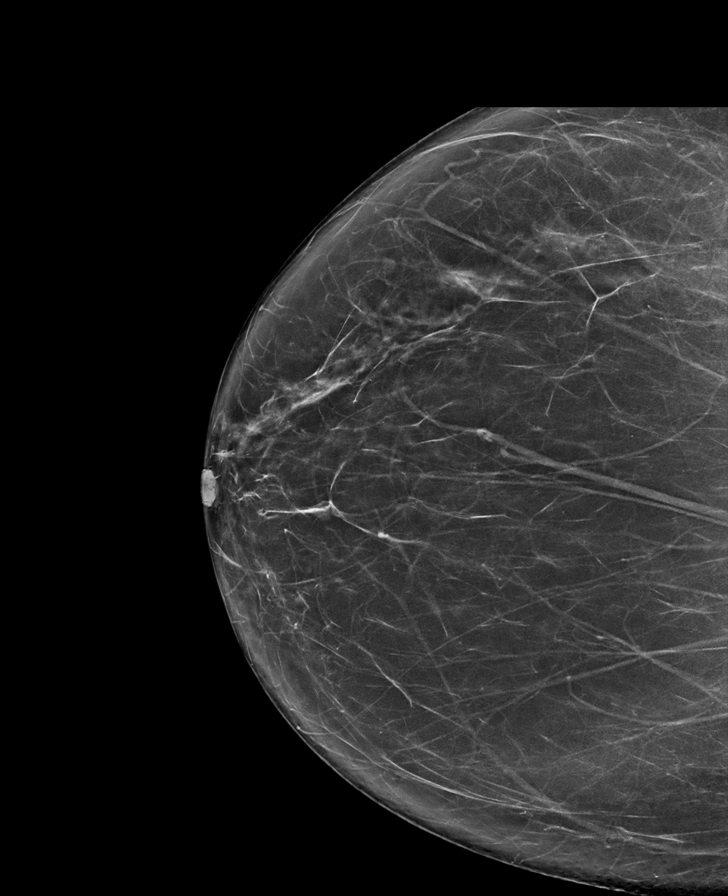

[R MLO tomo · tomo slice 51/102.0]
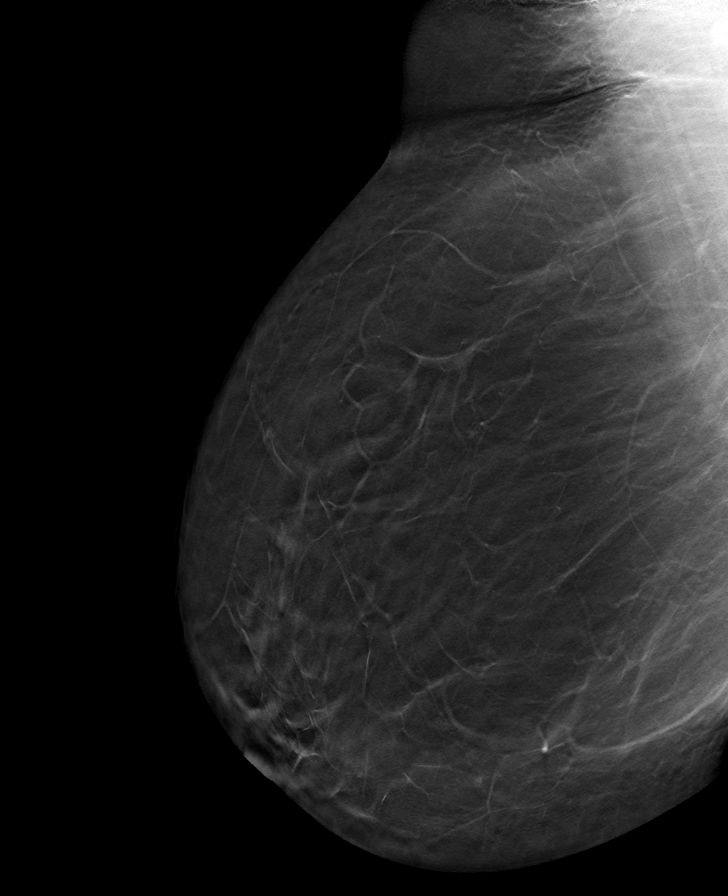

[R CC tomo · tomo slice 43/84.0]
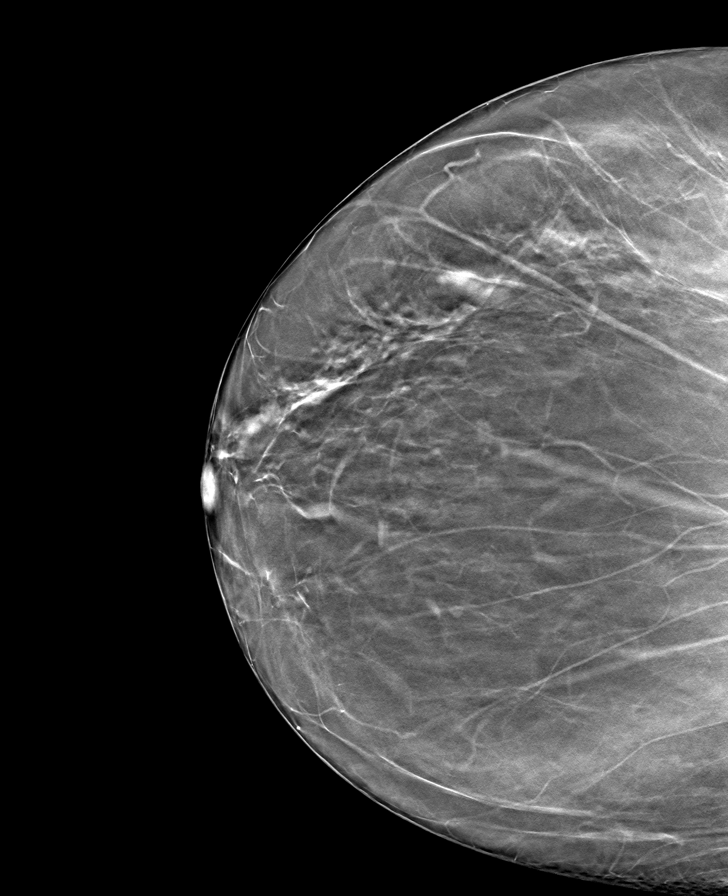

[L MLO tomo · tomo slice 48/95.0]
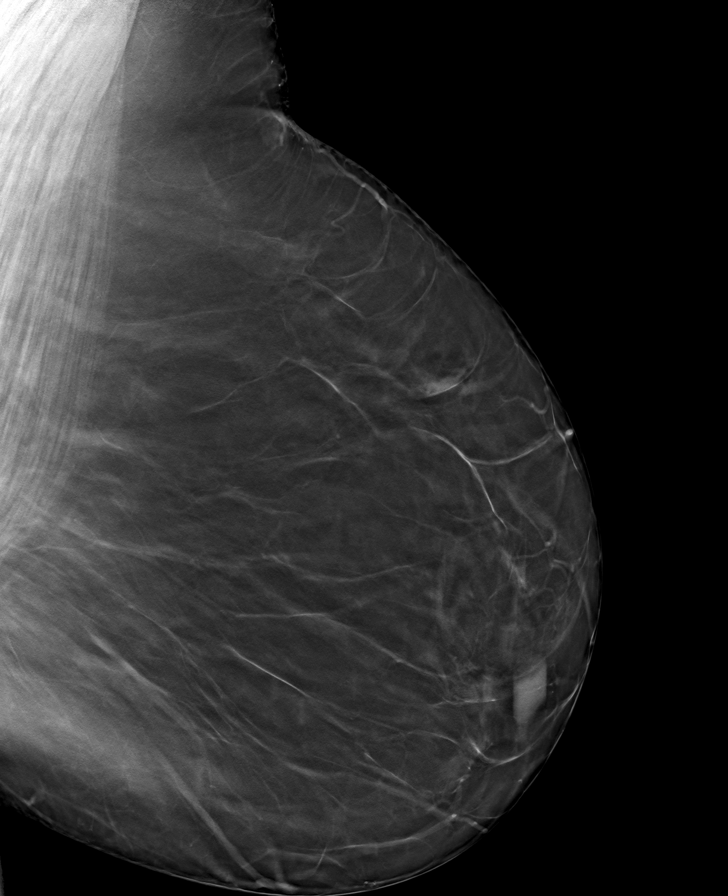

[L CC tomo · tomo slice 40/79.0]
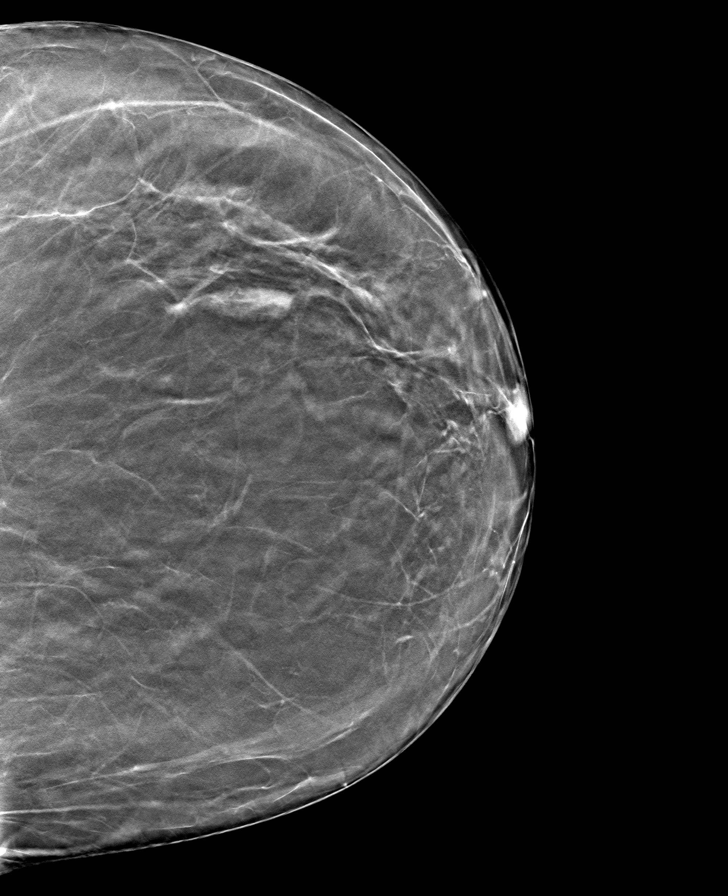

[8 of 24 positions shown; findings below may reference images not displayed]

ACR Breast Density Category b: There are scattered areas of
fibroglandular density.
FINDINGS: There are no findings suspicious for malignancy. Images were
processed with CAD.
IMPRESSION: No mammographic evidence of malignancy. A result letter of this
screening mammogram will be mailed directly to the patient.

RECOMMENDATION:
Screening mammogram in one year. (Code:CN-[id number])

BI-RADS CATEGORY  1: Negative.

## 2020-09-29 DIAGNOSIS — M1711 Unilateral primary osteoarthritis, right knee: Secondary | ICD-10-CM | POA: Diagnosis not present

## 2020-09-29 DIAGNOSIS — E785 Hyperlipidemia, unspecified: Secondary | ICD-10-CM | POA: Diagnosis not present

## 2020-09-29 DIAGNOSIS — I10 Essential (primary) hypertension: Secondary | ICD-10-CM | POA: Diagnosis not present

## 2020-12-28 DIAGNOSIS — M1711 Unilateral primary osteoarthritis, right knee: Secondary | ICD-10-CM | POA: Diagnosis not present

## 2020-12-28 DIAGNOSIS — I10 Essential (primary) hypertension: Secondary | ICD-10-CM | POA: Diagnosis not present

## 2020-12-28 DIAGNOSIS — E785 Hyperlipidemia, unspecified: Secondary | ICD-10-CM | POA: Diagnosis not present

## 2021-01-26 IMAGING — US US THYROID
1 series · 13 of 25 positions shown · non-contrast
Comparison: CT scan of the neck 07/04/2019

CLINICAL DATA: Incidental on CT.

EXAM:
THYROID ULTRASOUND
TECHNIQUE: Ultrasound examination of the thyroid gland and adjacent soft
tissues was performed.

[Series 1: us thyroid · 0.06mm/px · 13 of 111 slices shown]
[im 1/111]
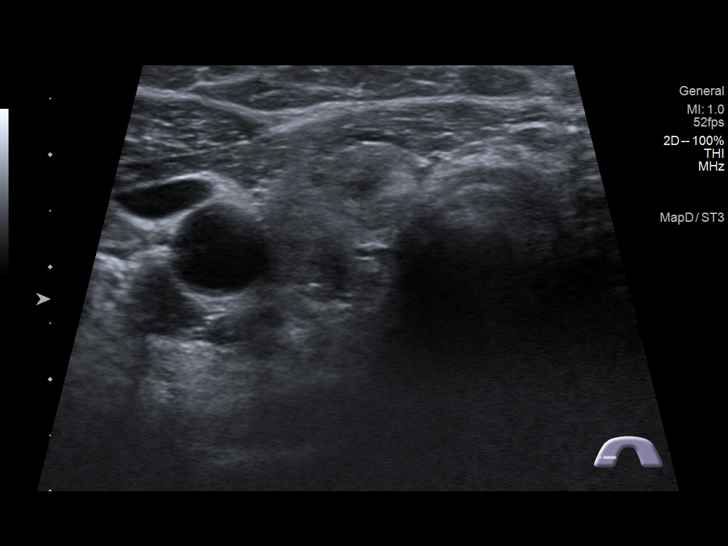
[im 10/111]
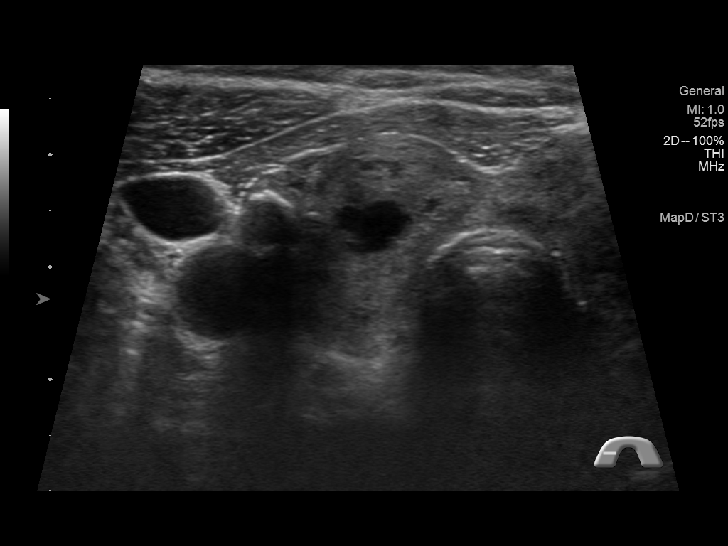
[im 19/111]
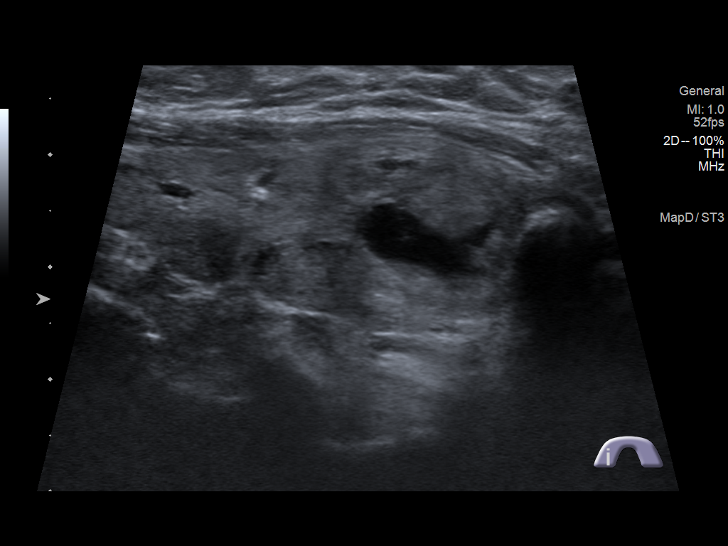
[im 28/111]
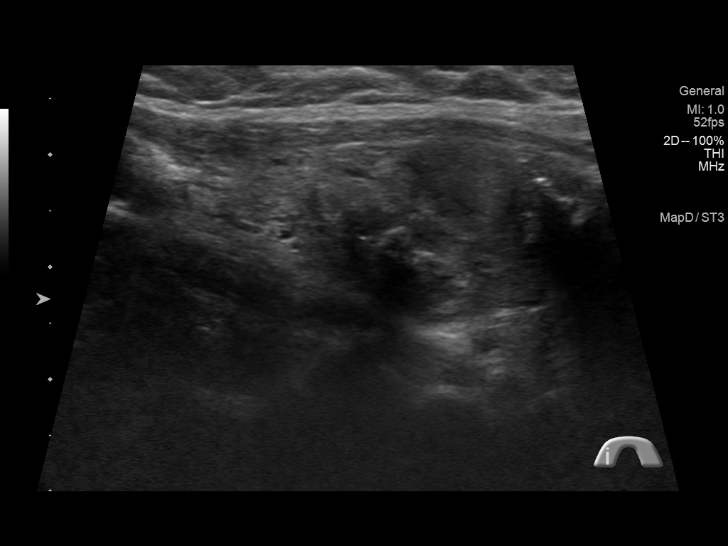
[im 37/111]
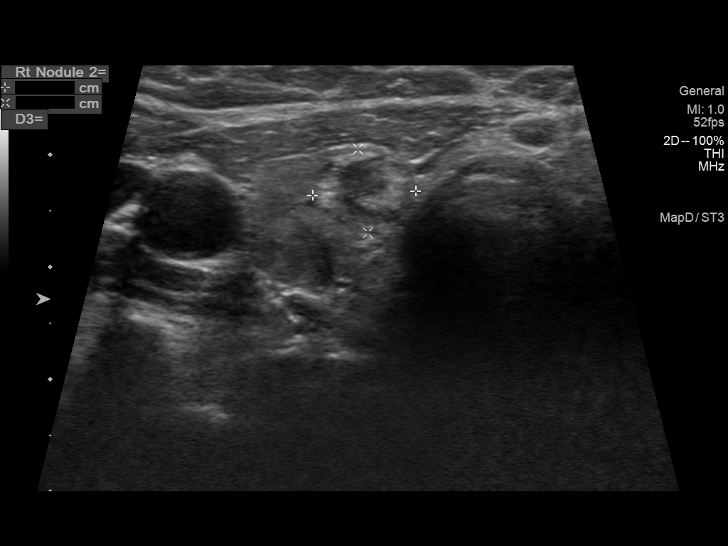
[im 46/111]
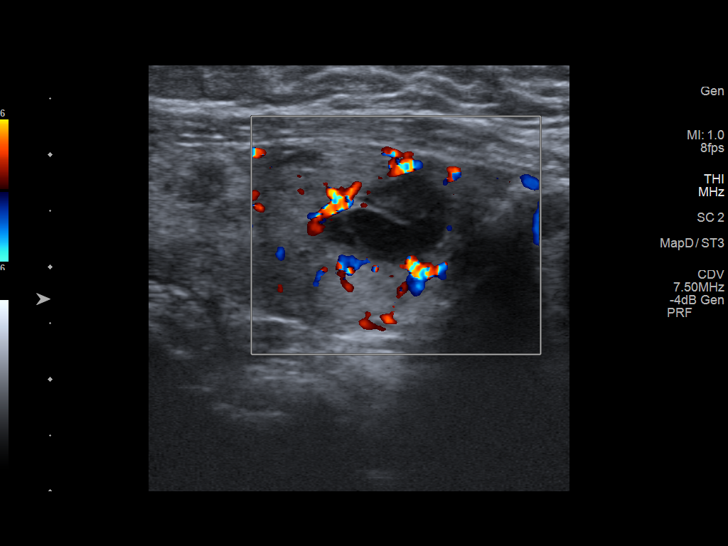
[im 56/111]
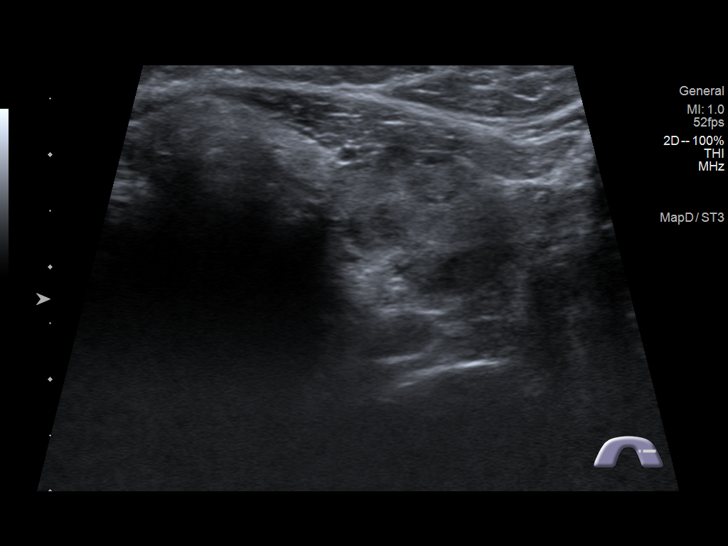
[im 65/111]
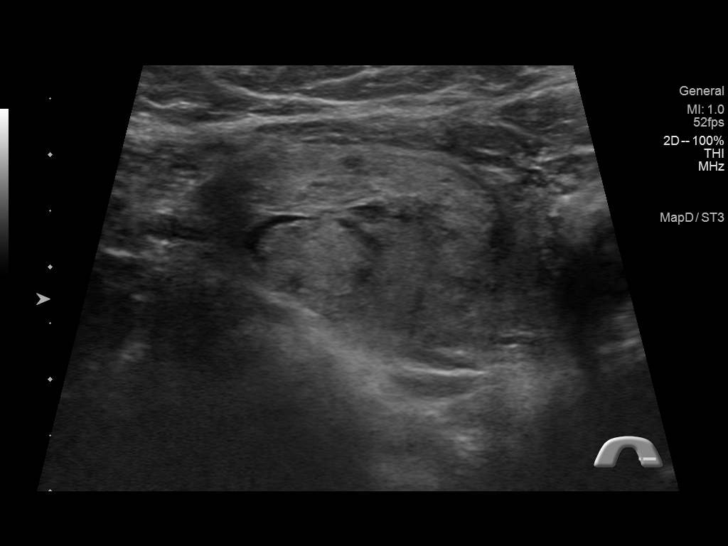
[im 74/111]
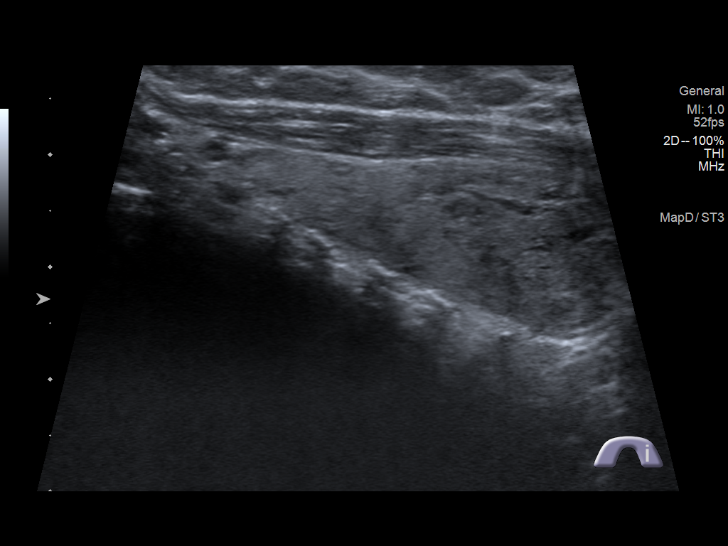
[im 83/111]
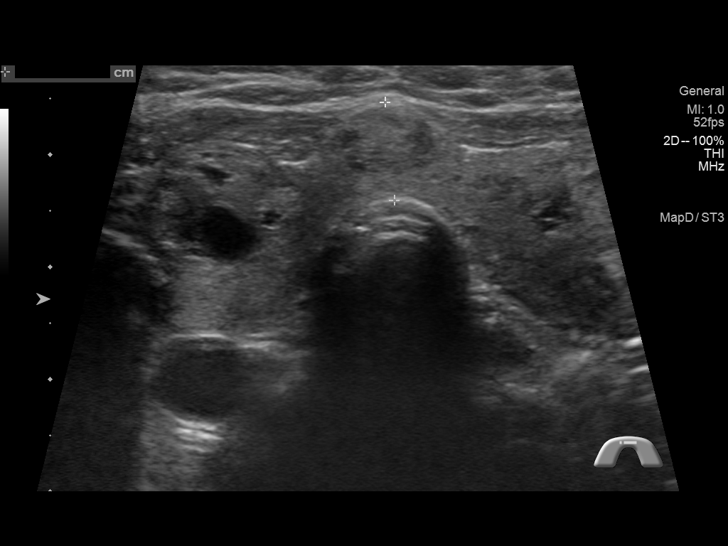
[im 92/111]
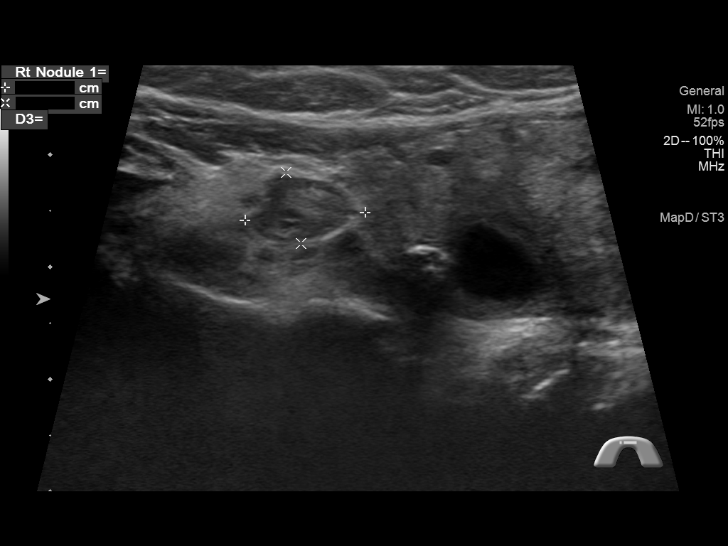
[im 101/111]
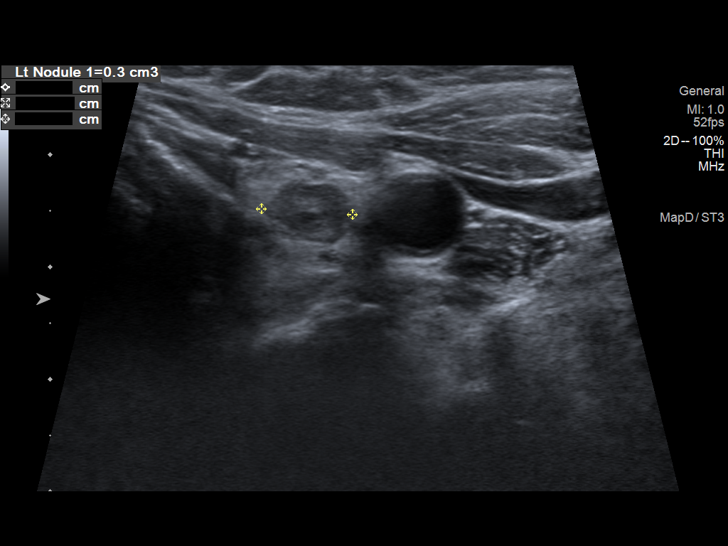
[im 111/111]
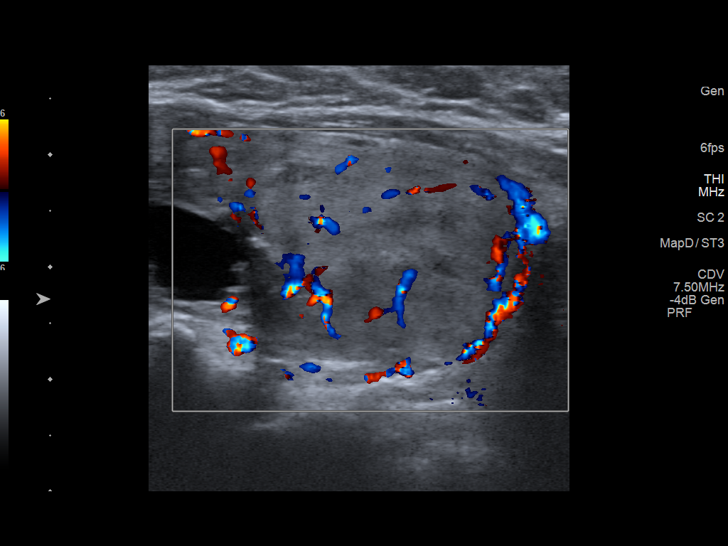

[13 of 25 positions shown; findings below may reference images not displayed]

FINDINGS: Parenchymal Echotexture: Moderately heterogenous

Isthmus: 0.9 cm

Right lobe: 5.2 x 1.8 x 2.0 cm

Left lobe: 5.2 x 2.0 x 2.8 cm

_________________________________________________________

Estimated total number of nodules >/= 1 cm: 6-10

Number of spongiform nodules >/=  2 cm not described below (TR1): 0

Number of mixed cystic and solid nodules >/= 1.5 cm not described
below (TR2): 0

_________________________________________________________

Nodule # 4:

Location: Right; Inferior

Maximum size: 1.4 cm; Other 2 dimensions: 1.1 x 1.3 cm

Composition: cannot determine (2)

Echogenicity: cannot determine (1)

Shape: not taller-than-wide (0)

Margins: ill-defined (0)

Echogenic foci: peripheral calcifications (2)

ACR TI-RADS total points: 5.

ACR TI-RADS risk category: TR4 (4-6 points).

ACR TI-RADS recommendations:

*Given size (>/= 1 - 1.4 cm) and appearance, a follow-up ultrasound
in 1 year should be considered based on TI-RADS criteria.

_________________________________________________________

Nodule # 7:

Location: Left; Inferior

Maximum size: 2.5 cm; Other 2 dimensions: 2.2 x 2.1 cm

Composition: solid/almost completely solid (2)

Echogenicity: isoechoic (1)

Shape: not taller-than-wide (0)

Margins: smooth (0)

Echogenic foci: none (0)

ACR TI-RADS total points: 3.

ACR TI-RADS risk category: TR3 (3 points).

ACR TI-RADS recommendations:

**Given size (>/= 2.5 cm) and appearance, fine needle aspiration of
this mildly suspicious nodule should be considered based on TI-RADS
criteria.

_________________________________________________________

Numerous additional thyroid nodules and cysts are present
bilaterally. The remaining nodules are either benign in appearance
(simple cysts, mixed cystic and solid nodules, or do not meet size
criteria to warrant further evaluation. No specific follow-up
required for these lesions.
IMPRESSION: 1. The 2.5 cm TI-RADS category 3 nodule in the left mid to lower
gland (labeled # 7 above) meets criteria for consideration of
fine-needle aspiration biopsy.
2. A 1.4 cm TI-RADS category 4 nodule in the right inferior gland
meets criteria for surveillance. Recommend follow-up ultrasound in 1
year.
3. Multiple additional bilateral thyroid nodules noted incidentally
consistent with multinodular goiter. None of these lesions meets
criteria for biopsy or follow-up.

The above is in keeping with the ACR TI-RADS recommendations - [HOSPITAL] 9321;[DATE].

## 2021-01-28 DIAGNOSIS — I10 Essential (primary) hypertension: Secondary | ICD-10-CM | POA: Diagnosis not present

## 2021-01-28 DIAGNOSIS — E785 Hyperlipidemia, unspecified: Secondary | ICD-10-CM | POA: Diagnosis not present

## 2021-02-15 DIAGNOSIS — E042 Nontoxic multinodular goiter: Secondary | ICD-10-CM | POA: Diagnosis not present

## 2021-02-15 DIAGNOSIS — E212 Other hyperparathyroidism: Secondary | ICD-10-CM | POA: Diagnosis not present

## 2021-02-16 ENCOUNTER — Telehealth: Payer: Self-pay | Admitting: "Endocrinology

## 2021-02-16 LAB — PTH, INTACT AND CALCIUM
Calcium: 10.3 mg/dL (ref 8.7–10.3)
PTH: 106 pg/mL — ABNORMAL HIGH (ref 15–65)

## 2021-02-16 LAB — VITAMIN D 25 HYDROXY (VIT D DEFICIENCY, FRACTURES): Vit D, 25-Hydroxy: 10.7 ng/mL — ABNORMAL LOW (ref 30.0–100.0)

## 2021-02-16 LAB — TSH: TSH: 1.73 u[IU]/mL (ref 0.450–4.500)

## 2021-02-16 LAB — CALCIUM, URINE, 24 HOUR
Calcium, 24H Urine: 172 mg/24 hr (ref 0–320)
Calcium, Urine: 8.6 mg/dL

## 2021-02-16 LAB — MAGNESIUM: Magnesium: 1.8 mg/dL (ref 1.6–2.3)

## 2021-02-16 LAB — CREATININE, URINE, 24 HOUR
Creatinine, 24H Ur: 1154 mg/24 hr (ref 800–1800)
Creatinine, Urine: 57.7 mg/dL

## 2021-02-16 LAB — T4, FREE: Free T4: 1 ng/dL (ref 0.82–1.77)

## 2021-02-16 LAB — PHOSPHORUS: Phosphorus: 2.7 mg/dL — ABNORMAL LOW (ref 3.0–4.3)

## 2021-02-16 NOTE — Telephone Encounter (Signed)
Patient did not get urine done do you want to see?

## 2021-02-16 NOTE — Telephone Encounter (Signed)
Yes, patient must come and she will do urine subsequently.

## 2021-02-16 NOTE — Telephone Encounter (Signed)
Yes she can be seen

## 2021-02-16 NOTE — Telephone Encounter (Signed)
Pt appt for tomorrow says they did labs but no 24 hour urine. Are we still seeing pt tomorrow?

## 2021-02-17 ENCOUNTER — Other Ambulatory Visit: Payer: Self-pay

## 2021-02-17 ENCOUNTER — Encounter: Payer: Self-pay | Admitting: "Endocrinology

## 2021-02-17 ENCOUNTER — Ambulatory Visit (INDEPENDENT_AMBULATORY_CARE_PROVIDER_SITE_OTHER): Payer: Medicare Other | Admitting: "Endocrinology

## 2021-02-17 VITALS — BP 116/78 | HR 56 | Ht 66.0 in | Wt 303.4 lb

## 2021-02-17 DIAGNOSIS — E042 Nontoxic multinodular goiter: Secondary | ICD-10-CM | POA: Diagnosis not present

## 2021-02-17 DIAGNOSIS — E559 Vitamin D deficiency, unspecified: Secondary | ICD-10-CM

## 2021-02-17 DIAGNOSIS — E212 Other hyperparathyroidism: Secondary | ICD-10-CM

## 2021-02-17 MED ORDER — VITAMIN D (ERGOCALCIFEROL) 1.25 MG (50000 UNIT) PO CAPS: 50000.0000 [IU] | ORAL_CAPSULE | ORAL | 0 refills | Status: DC

## 2021-02-17 MED ORDER — CHOLECALCIFEROL 50 MCG (2000 UT) PO CAPS: 1.0000 | ORAL_CAPSULE | Freq: Every day | ORAL | 1 refills | Status: AC

## 2021-02-17 NOTE — [place] Notes (Signed)
02/17/2021    Endocrinology follow-up note      Melissa Garner is a 65 y.o.-year-old female, referred by her  PCP  Jordan Hawks, NP , for evaluation for hypercalcemia/hyperparathyroidism.  She is returning for follow-up with repeat labs.  Past Medical History:  Diagnosis Date  . Essential hypertension   . Hyperlipidemia   . Lumbar disc disease    Left leg pain  . Obesity   . Osteoarthritis   . Parathyroid abnormality [facility])     Past Surgical History:  Procedure Laterality Date  . ABDOMINAL HYSTERECTOMY     Partial  . CARPAL TUNNEL RELEASE    . COLONOSCOPY N/A 01/14/2016   Procedure: COLONOSCOPY;  Surgeon: Danie Binder, MD;  [facility];  Service: Endoscopy;  Laterality: N/A;  230   . KNEE SURGERY      Social History   Tobacco Use  . Smoking status: Never Smoker  . Smokeless tobacco: Never Used  Vaping Use  . Vaping Use: Never used  Substance Use Topics  . Alcohol use: No    Alcohol/week: 0.0 standard drinks  . Drug use: No    Family History  Problem Relation Age of Onset  . Heart failure Mother   . Alcoholism Father   . Cancer Father   . COPD Sister   . Hypertension Sister   . Arthritis Sister   . Seizures Sister   . Hypertension Brother   . Arthritis Brother   . Hypertension Brother   . Arthritis Brother   . Hypertension Brother   . Arthritis Brother   . Hypertension Sister   . Hypertension Sister   . COPD Sister   . Hypertension Daughter   . Hypertension Son   . Crohn's disease Son   . Colon cancer Neg Hx     Outpatient Encounter Medications as of 02/17/2021  Medication Sig  . Cholecalciferol 50 MCG (2000 UT) CAPS Take 1 capsule (2,000 Units total) by mouth daily with breakfast.  . Vitamin D, Ergocalciferol, (DRISDOL) 1.25 MG (50000 UNIT) CAPS capsule Take 1 capsule (50,000 Units total) by mouth every 7 (seven) days.  Marland Kitchen acetaminophen (TYLENOL) 325 MG tablet Take by mouth.  Marland Kitchen albuterol (VENTOLIN HFA) 108 (90 Base) MCG/ACT  inhaler Inhale 2 puffs into the lungs daily.  Marland Kitchen amLODipine (NORVASC) 10 MG tablet Take 1 tablet (10 mg total) by mouth daily.  Marland Kitchen atorvastatin (LIPITOR) 20 MG tablet Take 20 mg by mouth daily.  . clobetasol cream (TEMOVATE) 0.05 % Apply topically.  . diclofenac sodium (VOLTAREN) 1 % GEL Apply 1 application topically daily as needed.  Marland Kitchen ibuprofen (ADVIL,MOTRIN) 800 MG tablet Take 1 tablet (800 mg total) by mouth 3 (three) times daily.  Marland Kitchen lisinopril-hydrochlorothiazide (PRINZIDE,ZESTORETIC) 20-12.5 MG tablet Take 1 tablet by mouth daily.   No facility-administered encounter medications on file as of 02/17/2021.    No Known Allergies   HPI  Melissa Garner is returning for follow-up after she was seen in consultation for hypercalcemia associated with elevated PTH level.  Her presentation was consistent with mild, early primary hyperparathyroidism.  She was put on observation only.  Her repeat labs show improved PTH of 106 from 175, calcium 10.3 improved from 10.8.  Her 24-hour urine calcium however increased from 74-1 72.  Her new labs show profound vitamin D deficiency of 10.7.  Patient has no previously known history of parathyroid, pituitary, adrenal dysfunctions; no family history of such dysfunctions. -She has normal renal function.  She did not  have any recent bone density.  No prior history of fragility fractures or falls. No history of  kidney stones.  No history of CKD.   she is not on HCTZ or other thiazide therapy. she is not on calcium supplements,  she eats dairy and green, leafy, vegetables on average amounts.  she does not have a family history of hypercalcemia, pituitary tumors, thyroid cancer, or osteoporosis.  Her bone density in October 2021 was normal.  During recent trip to ER she underwent CT scan of head and neck which revealed multiple thyroid nodules.  She was sent for dedicated thyroid ultrasound.  She underwent thyroid ultrasound which confirms multiple nodules  including 2.5 cm suspicious nodule in the left lobe.  Biopsy of the suspicious nodule before this visit was benign. -She does not have any new complaints since last visit.   ROS: Limited as above.  PE: BP 116/78   Pulse (!) 56   Ht 5\' 6"  (1.676 m)   Wt (!) 303 lb 6.4 oz (137.6 kg)   BMI 48.97 kg/m , Body mass index is 48.97 kg/m. Wt Readings from Last 3 Encounters:  02/17/21 (!) 303 lb 6.4 oz (137.6 kg)  08/19/20 295 lb 6.4 oz (134 kg)  06/02/20 (!) 304 lb (137.9 kg)     CMP ( most recent) CMP     Component Value Date/Time   NA 139 07/04/2019 0905   K 3.5 07/04/2019 0905   CL 103 07/04/2019 0905   CO2 28 07/04/2019 0905   GLUCOSE 130 (H) 07/04/2019 0905   BUN 9 07/04/2019 0905   CREATININE 0.68 07/04/2019 0905   CALCIUM 10.3 02/15/2021 1206   PROT 7.7 01/12/2013 1353   ALBUMIN 3.9 01/12/2013 1353   AST 15 01/12/2013 1353   ALT 10 01/12/2013 1353   ALKPHOS 103 01/12/2013 1353   BILITOT 0.4 01/12/2013 1353   GFRNONAA >60 07/04/2019 0905   GFRAA >60 07/04/2019 0905     Lab Results  Component Value Date   TSH 1.730 02/15/2021   TSH 1.45 08/05/2020   TSH 1.38 05/20/2019   TSH 1.716 07/11/2013   FREET4 1.00 02/15/2021   FREET4 1.0 08/05/2020   FREET4 0.94 07/11/2013    May 20, 2019 PTH 119, calcium 10.7  Assessment: 1. Hypercalcemia / Hyperparathyroidism 2.  Multinodular goiter-benign FNA. 3.  Vitamin D deficiency  Plan: Patient has had several instances of elevated calcium with the highest being 10.8 with a corresponding PTH of 175.  Her recent labs showed improved calcium of 10.3 and PTH of 106.  Her 24-hour urine calcium however has increased from 74-172.  This is still considered secondary to mild, primary hyperparathyroidism.  Her bone density is normal.   - No apparent complications from hypercalcemia/hyperparathyroidism: no history of  nephrolithiasis,  osteoporosis,fragility fractures. No abdominal pain, no major mood disorders, no bone  pain.  -She will not be considered for surgery at this time, with plan to repeat PTH/calcium before her next visit in 6 months.   For her profound vitamin D deficiency, should be considered for replacement with ergocalciferol and cholecalciferol.    I discussed initiated ergocalciferol 50 , 000 units weekly for 12 weeks.  She will start vitamin D3 2000 units simultaneously and continue for maintenance.    She is advised to avoid any over-the-counter calcium supplements.   Regarding her multinodular goiter, she is status post biopsy of left lobe nodule with benign outcomes in October 2020.  She will not need any definitive antithyroid intervention  at this time. She will be considered for repeat thyroid ultrasound after her next visit.   She is advised to maintain close follow-up with her PCP.   I spent 33 minutes in the care of the patient today including review of labs from Thyroid Function, CMP, and other relevant labs ; imaging/biopsy records (current and previous including abstractions from other facilities); face-to-face time discussing  her lab results and symptoms, medications doses, her options of short and long term treatment based on the latest standards of care / guidelines;   and documenting the encounter.  Cale Decarolis  participated in the discussions, expressed understanding, and voiced agreement with the above plans.  All questions were answered to her satisfaction. she is encouraged to contact clinic should she have any questions or concerns prior to her return visit.     - Return in about 4 months (around 06/19/2021) for F/U with Pre-visit Labs, [place] with Nitika Jackowski.   Glade Lloyd, MD [facility] [address] [place],  [address] Phone: [phone]  Fax: [phone]    This note was partially dictated with voice recognition software. Similar sounding words can be transcribed inadequately or may not  be  corrected upon review.  02/17/2021, 2:42 PM

## 2021-02-22 ENCOUNTER — Telehealth: Payer: Self-pay

## 2021-02-22 NOTE — Telephone Encounter (Signed)
Patient is asking for a call back regarding her medication

## 2021-02-22 NOTE — Telephone Encounter (Signed)
Discussed with pt instructions for her Vitamin D, to take Vitamin D2 50,000 units once a week for 12 weeks and simultaneously take Vitamin D3 2,000 units daily and continue for maintenance. Pt voiced understanding.

## 2021-03-09 DIAGNOSIS — I8392 Asymptomatic varicose veins of left lower extremity: Secondary | ICD-10-CM | POA: Diagnosis not present

## 2021-03-09 DIAGNOSIS — E785 Hyperlipidemia, unspecified: Secondary | ICD-10-CM | POA: Diagnosis not present

## 2021-03-09 DIAGNOSIS — M150 Primary generalized (osteo)arthritis: Secondary | ICD-10-CM | POA: Diagnosis not present

## 2021-03-09 DIAGNOSIS — I10 Essential (primary) hypertension: Secondary | ICD-10-CM | POA: Diagnosis not present

## 2021-04-29 DIAGNOSIS — I10 Essential (primary) hypertension: Secondary | ICD-10-CM | POA: Diagnosis not present

## 2021-04-29 DIAGNOSIS — M1711 Unilateral primary osteoarthritis, right knee: Secondary | ICD-10-CM | POA: Diagnosis not present

## 2021-04-29 DIAGNOSIS — E785 Hyperlipidemia, unspecified: Secondary | ICD-10-CM | POA: Diagnosis not present

## 2021-05-24 ENCOUNTER — Ambulatory Visit: Payer: Medicare Other | Admitting: Orthopedic Surgery

## 2021-06-07 ENCOUNTER — Ambulatory Visit: Payer: Medicare Other

## 2021-06-07 ENCOUNTER — Ambulatory Visit: Payer: Medicare Other | Admitting: Orthopedic Surgery

## 2021-06-07 ENCOUNTER — Encounter: Payer: Self-pay | Admitting: Orthopedic Surgery

## 2021-06-07 ENCOUNTER — Other Ambulatory Visit: Payer: Self-pay

## 2021-06-07 ENCOUNTER — Telehealth: Payer: Self-pay

## 2021-06-07 VITALS — BP 155/86 | HR 84 | Ht 66.0 in | Wt 298.6 lb

## 2021-06-07 DIAGNOSIS — M25512 Pain in left shoulder: Secondary | ICD-10-CM

## 2021-06-07 DIAGNOSIS — M7542 Impingement syndrome of left shoulder: Secondary | ICD-10-CM

## 2021-06-07 DIAGNOSIS — W19XXXA Unspecified fall, initial encounter: Secondary | ICD-10-CM | POA: Diagnosis not present

## 2021-06-07 MED ORDER — TRAMADOL-ACETAMINOPHEN 37.5-325 MG PO TABS: 1.0000 | ORAL_TABLET | ORAL | 0 refills | Status: AC | PRN

## 2021-06-07 MED ORDER — MELOXICAM 7.5 MG PO TABS: 7.5000 mg | ORAL_TABLET | Freq: Every day | ORAL | 5 refills | Status: DC

## 2021-06-07 NOTE — Telephone Encounter (Signed)
Pt.notified

## 2021-06-07 NOTE — Telephone Encounter (Signed)
Patient requesting a Beta Protein Lab placed with her others. Please Advise

## 2021-06-07 NOTE — Patient Instructions (Signed)
You have received an injection of steroids into the joint. 15% of patients will have increased pain within the 24 hours postinjection.   This is transient and will go away.   We recommend that you use ice packs on the injection site for 20 minutes every 2 hours and extra strength Tylenol 2 tablets every 8 as needed until the pain resolves.  If you continue to have pain after taking the Tylenol and using the ice please call the office for further instructions.   Start the following medication s  Meds ordered this encounter  Medications   traMADol-acetaminophen (ULTRACET) 37.5-325 MG tablet    Sig: Take 1 tablet by mouth every 4 (four) hours as needed for up to 5 days.    Dispense:  30 tablet    Refill:  0   meloxicam (MOBIC) 7.5 MG tablet    Sig: Take 1 tablet (7.5 mg total) by mouth daily.    Dispense:  30 tablet    Refill:  5

## 2021-06-07 NOTE — [place] Notes (Signed)
Chief Complaint  Patient presents with   Shoulder Pain    Left shoulder/DOI 05/11/21 pt fell   65 year old female fell on July 12 putting in her groceries.  She fell on the right but her left shoulder hit the tailgate  She complains of pain around the wrist shoulder but denies pain in the neck or upper forearm  She also notices decreased ability to reach behind her back and some stretching pulling sensations when she does that.  She has some stretching pulling sensations as she reaches forward as well  Focused exam of the shoulder and neck shows no tenderness in the neck or forearm tenderness over the [facility] joint and somewhat over the anterior joint line posterior joint line nontender decreased internal rotation only to the hip pocket forward elevation and abduction are normal including normal strength  No instability noted  Imaging 3 views of the left shoulder were negative for any acute fracture or AC separation   Assessment and plan   Encounter Diagnoses  Name Primary?   Acute pain of left shoulder    Shoulder impingement, left Yes    Recommend stretching strengthening program  Recommend following meds:  Meds ordered this encounter  Medications   traMADol-acetaminophen (ULTRACET) 37.5-325 MG tablet    Sig: Take 1 tablet by mouth every 4 (four) hours as needed for up to 5 days.    Dispense:  30 tablet    Refill:  0   meloxicam (MOBIC) 7.5 MG tablet    Sig: Take 1 tablet (7.5 mg total) by mouth daily.    Dispense:  30 tablet    Refill:  5     As needed follow-up  Past Medical History:  Diagnosis Date   Essential hypertension    Hyperlipidemia    Lumbar disc disease    Left leg pain   Obesity    Osteoarthritis    Parathyroid abnormality (HCC)    Past Surgical History:  Procedure Laterality Date   ABDOMINAL HYSTERECTOMY     Partial   CARPAL TUNNEL RELEASE     COLONOSCOPY N/A 01/14/2016   Procedure: COLONOSCOPY;  Surgeon: Danie Binder, MD;  [facility];  Service: Endoscopy;  Laterality: N/A;  230    KNEE SURGERY     Social History   Tobacco Use   Smoking status: Never   Smokeless tobacco: Never  Vaping Use   Vaping Use: Never used  Substance Use Topics   Alcohol use: No    Alcohol/week: 0.0 standard drinks   Drug use: No

## 2021-06-16 DIAGNOSIS — E212 Other hyperparathyroidism: Secondary | ICD-10-CM | POA: Diagnosis not present

## 2021-06-16 DIAGNOSIS — E559 Vitamin D deficiency, unspecified: Secondary | ICD-10-CM | POA: Diagnosis not present

## 2021-06-17 LAB — PTH, INTACT AND CALCIUM
Calcium: 11 mg/dL — ABNORMAL HIGH (ref 8.7–10.3)
PTH: 119 pg/mL — ABNORMAL HIGH (ref 15–65)

## 2021-06-17 LAB — TSH: TSH: 1.25 u[IU]/mL (ref 0.450–4.500)

## 2021-06-17 LAB — T4, FREE: Free T4: 1.04 ng/dL (ref 0.82–1.77)

## 2021-06-17 LAB — VITAMIN D 25 HYDROXY (VIT D DEFICIENCY, FRACTURES): Vit D, 25-Hydroxy: 42.7 ng/mL (ref 30.0–100.0)

## 2021-06-22 ENCOUNTER — Other Ambulatory Visit: Payer: Self-pay

## 2021-06-22 ENCOUNTER — Ambulatory Visit (INDEPENDENT_AMBULATORY_CARE_PROVIDER_SITE_OTHER): Payer: Medicare Other | Admitting: "Endocrinology

## 2021-06-22 ENCOUNTER — Encounter: Payer: Self-pay | Admitting: "Endocrinology

## 2021-06-22 VITALS — BP 136/82 | HR 64 | Ht 66.0 in | Wt 305.6 lb

## 2021-06-22 DIAGNOSIS — E210 Primary hyperparathyroidism: Secondary | ICD-10-CM

## 2021-06-22 NOTE — Progress Notes (Signed)
06/22/2021    Endocrinology follow-up note      Melissa Garner is a 65 y.o.-year-old female, referred by her  PCP  Jordan Hawks, FNP , for evaluation for hypercalcemia/hyperparathyroidism.  She is returning for follow-up with repeat labs.  Past Medical History:  Diagnosis Date   Essential hypertension    Hyperlipidemia    Lumbar disc disease    Left leg pain   Obesity    Osteoarthritis    Parathyroid abnormality (HCC)     Past Surgical History:  Procedure Laterality Date   ABDOMINAL HYSTERECTOMY     Partial   CARPAL TUNNEL RELEASE     COLONOSCOPY N/A 01/14/2016   Procedure: COLONOSCOPY;  Surgeon: Danie Binder, MD;  [facility];  Service: Endoscopy;  Laterality: N/A;  230    KNEE SURGERY      Social History   Tobacco Use   Smoking status: Never   Smokeless tobacco: Never  Vaping Use   Vaping Use: Never used  Substance Use Topics   Alcohol use: No    Alcohol/week: 0.0 standard drinks   Drug use: No    Family History  Problem Relation Age of Onset   Heart failure Mother    Alcoholism Father    Cancer Father    COPD Sister    Hypertension Sister    Arthritis Sister    Seizures Sister    Hypertension Brother    Arthritis Brother    Hypertension Brother    Arthritis Brother    Hypertension Brother    Arthritis Brother    Hypertension Sister    Hypertension Sister    COPD Sister    Hypertension Daughter    Hypertension Son    Crohn's disease Son    Colon cancer Neg Hx     Outpatient Encounter Medications as of 06/22/2021  Medication Sig   TRAMADOL HCL PO Take 1 tablet by mouth every 4 (four) hours as needed.   acetaminophen (TYLENOL) 325 MG tablet Take by mouth.   albuterol (VENTOLIN HFA) 108 (90 Base) MCG/ACT inhaler Inhale 2 puffs into the lungs daily.   amLODipine (NORVASC) 10 MG tablet Take 1 tablet (10 mg total) by mouth daily.   atorvastatin (LIPITOR) 20 MG tablet Take 20 mg by mouth daily.   Cholecalciferol 50 MCG (2000 UT)  CAPS Take 1 capsule (2,000 Units total) by mouth daily with breakfast.   clobetasol cream (TEMOVATE) 0.05 % Apply topically.   diclofenac sodium (VOLTAREN) 1 % GEL Apply 1 application topically daily as needed.   ibuprofen (ADVIL) 200 MG tablet Take 400 mg by mouth in the morning and at bedtime.   lisinopril-hydrochlorothiazide (PRINZIDE,ZESTORETIC) 20-12.5 MG tablet Take 1 tablet by mouth daily.   meloxicam (MOBIC) 7.5 MG tablet Take 1 tablet (7.5 mg total) by mouth daily.   [DISCONTINUED] Vitamin D, Ergocalciferol, (DRISDOL) 1.25 MG (50000 UNIT) CAPS capsule Take 1 capsule (50,000 Units total) by mouth every 7 (seven) days.   No facility-administered encounter medications on file as of 06/22/2021.    No Known Allergies   HPI  Melissa Garner is returning for follow-up after she was seen in consultation for hypercalcemia associated with elevated PTH level.  Her presentation was consistent with early primary hyperparathyroidism.  She was put on observation only.  Her repeat previsit labs show increasing PTH at 119, increasing calcium at 11.0.    Her 24-hour urine calcium however increased from 74-172.  She is status post treatment for vitamin D deficiency  currently replete at 42.7. Patient has no previously known history of parathyroid, pituitary, adrenal dysfunctions; no family history of such dysfunctions. -She has normal renal function.  She did not have any recent bone density.  No prior history of fragility fractures or falls. No history of  kidney stones.  No history of CKD.   she is not on HCTZ or other thiazide therapy. she is not on calcium supplements,  she eats dairy and green, leafy, vegetables on average amounts.  she does not have a family history of hypercalcemia, pituitary tumors, thyroid cancer, or osteoporosis.  Her bone density in October 2021 was normal.  During recent trip to ER she underwent CT scan of head and neck which revealed multiple thyroid nodules.  She was  sent for dedicated thyroid ultrasound.  She underwent thyroid ultrasound which confirms multiple nodules including 2.5 cm suspicious nodule in the left lobe.  Biopsy of the suspicious nodule before this visit was benign. -She does not have any new complaints since last visit.   ROS: Limited as above.  PE: BP 136/82   Pulse 64   Ht '5\' 6"'$  (1.676 m)   Wt (!) 305 lb 9.6 oz (138.6 kg)   BMI 49.33 kg/m , Body mass index is 49.33 kg/m. Wt Readings from Last 3 Encounters:  06/22/21 (!) 305 lb 9.6 oz (138.6 kg)  06/07/21 298 lb 9.6 oz (135.4 kg)  02/17/21 (!) 303 lb 6.4 oz (137.6 kg)     CMP ( most recent) CMP     Component Value Date/Time   NA 139 07/04/2019 0905   K 3.5 07/04/2019 0905   CL 103 07/04/2019 0905   CO2 28 07/04/2019 0905   GLUCOSE 130 (H) 07/04/2019 0905   BUN 9 07/04/2019 0905   CREATININE 0.68 07/04/2019 0905   CALCIUM 11.0 (H) 06/16/2021 1428   PROT 7.7 01/12/2013 1353   ALBUMIN 3.9 01/12/2013 1353   AST 15 01/12/2013 1353   ALT 10 01/12/2013 1353   ALKPHOS 103 01/12/2013 1353   BILITOT 0.4 01/12/2013 1353   GFRNONAA >60 07/04/2019 0905   GFRAA >60 07/04/2019 0905     Lab Results  Component Value Date   TSH 1.250 06/16/2021   TSH 1.730 02/15/2021   TSH 1.45 08/05/2020   TSH 1.38 05/20/2019   TSH 1.716 07/11/2013   FREET4 1.04 06/16/2021   FREET4 1.00 02/15/2021   FREET4 1.0 08/05/2020   FREET4 0.94 07/11/2013    May 20, 2019 PTH 119, calcium 10.7  Assessment: 1. Hypercalcemia / Hyperparathyroidism 2.  Multinodular goiter-benign FNA. 3.  Vitamin D deficiency  Plan: Patient has had several instances of elevated calcium with the highest being 11, with a corresponding high PTH of 175.  Her previous 24-hour urine calcium was at 172.  She is status post treatment for profound vitamin D deficiency.  This presentation is still consistent with primary hyperparathyroidism.    Her bone density is normal.   - No apparent complications from  hypercalcemia/hyperparathyroidism: no history of  nephrolithiasis,  osteoporosis,fragility fractures. No abdominal pain, no major mood disorders, no bone pain.  -She will be considered for surgical treatment at this time.  I discussed and arrange referral to Dr. Armandina Gemma in [place] surgery.  She will return after her surgery with repeat labs.   She will be continued on vitamin D3 2000 units daily.     She is advised to avoid any over-the-counter calcium supplements.   Regarding her multinodular goiter, she is status  post biopsy of left lobe nodule with benign outcomes in October 2020.  She will not need any definitive antithyroid intervention at this time.  She is advised to maintain close follow-up with her PCP.   I spent 25 minutes in the care of the patient today including review of labs from Thyroid Function, CMP, and other relevant labs ; imaging/biopsy records (current and previous including abstractions from other facilities); face-to-face time discussing  her lab results and symptoms, medications doses, her options of short and long term treatment based on the latest standards of care / guidelines;   and documenting the encounter.  Melissa Garner  participated in the discussions, expressed understanding, and voiced agreement with the above plans.  All questions were answered to her satisfaction. she is encouraged to contact clinic should she have any questions or concerns prior to her return visit.    - Return in about 9 weeks (around 08/24/2021) for F/U with Labs after Surgery.   Glade Lloyd, MD [facility] [address] [place], [place] [address] Phone: [phone]  Fax: [phone]    This note was partially dictated with voice recognition software. Similar sounding words can be transcribed inadequately or may not  be corrected upon review.  06/22/2021, 12:47 PM

## 2021-06-30 DIAGNOSIS — M1711 Unilateral primary osteoarthritis, right knee: Secondary | ICD-10-CM | POA: Diagnosis not present

## 2021-06-30 DIAGNOSIS — E785 Hyperlipidemia, unspecified: Secondary | ICD-10-CM | POA: Diagnosis not present

## 2021-06-30 DIAGNOSIS — I10 Essential (primary) hypertension: Secondary | ICD-10-CM | POA: Diagnosis not present

## 2021-07-13 DIAGNOSIS — E785 Hyperlipidemia, unspecified: Secondary | ICD-10-CM | POA: Diagnosis not present

## 2021-07-13 DIAGNOSIS — I10 Essential (primary) hypertension: Secondary | ICD-10-CM | POA: Diagnosis not present

## 2021-07-13 DIAGNOSIS — M159 Polyosteoarthritis, unspecified: Secondary | ICD-10-CM | POA: Diagnosis not present

## 2021-07-30 DIAGNOSIS — E785 Hyperlipidemia, unspecified: Secondary | ICD-10-CM | POA: Diagnosis not present

## 2021-07-30 DIAGNOSIS — I10 Essential (primary) hypertension: Secondary | ICD-10-CM | POA: Diagnosis not present

## 2021-07-30 DIAGNOSIS — M1711 Unilateral primary osteoarthritis, right knee: Secondary | ICD-10-CM | POA: Diagnosis not present

## 2021-08-25 ENCOUNTER — Ambulatory Visit: Payer: Medicare Other | Admitting: "Endocrinology

## 2021-08-30 DIAGNOSIS — E785 Hyperlipidemia, unspecified: Secondary | ICD-10-CM | POA: Diagnosis not present

## 2021-08-30 DIAGNOSIS — M1711 Unilateral primary osteoarthritis, right knee: Secondary | ICD-10-CM | POA: Diagnosis not present

## 2021-08-30 DIAGNOSIS — I10 Essential (primary) hypertension: Secondary | ICD-10-CM | POA: Diagnosis not present

## 2021-08-31 DIAGNOSIS — E210 Primary hyperparathyroidism: Secondary | ICD-10-CM | POA: Diagnosis not present

## 2021-09-03 ENCOUNTER — Other Ambulatory Visit: Payer: Self-pay | Admitting: Surgery

## 2021-09-03 ENCOUNTER — Other Ambulatory Visit (HOSPITAL_COMMUNITY): Payer: Self-pay | Admitting: Surgery

## 2021-09-03 DIAGNOSIS — E210 Primary hyperparathyroidism: Secondary | ICD-10-CM

## 2021-09-10 ENCOUNTER — Ambulatory Visit (HOSPITAL_COMMUNITY)
Admission: RE | Admit: 2021-09-10 | Discharge: 2021-09-10 | Disposition: A | Discharge status: Home / Self Care | Payer: Medicare Other | Source: Ambulatory Visit | Attending: Surgery | Admitting: Surgery

## 2021-09-10 ENCOUNTER — Other Ambulatory Visit: Payer: Self-pay

## 2021-09-10 DIAGNOSIS — E041 Nontoxic single thyroid nodule: Secondary | ICD-10-CM | POA: Diagnosis not present

## 2021-09-10 DIAGNOSIS — E210 Primary hyperparathyroidism: Secondary | ICD-10-CM | POA: Insufficient documentation

## 2021-09-13 NOTE — Progress Notes (Signed)
USN negative for adenoma.  Await nuclear med parathyroid scan.  tmg  Armandina Gemma, [facility] [place] practice Office: [phone]

## 2021-09-20 ENCOUNTER — Other Ambulatory Visit (HOSPITAL_COMMUNITY): Payer: Self-pay | Admitting: Surgery

## 2021-09-20 ENCOUNTER — Other Ambulatory Visit: Payer: Self-pay | Admitting: Surgery

## 2021-09-20 DIAGNOSIS — E210 Primary hyperparathyroidism: Secondary | ICD-10-CM

## 2021-09-27 ENCOUNTER — Encounter (HOSPITAL_COMMUNITY)
Admission: RE | Admit: 2021-09-27 | Discharge: 2021-09-27 | Disposition: A | Discharge status: Home / Self Care | Payer: Medicare Other | Source: Ambulatory Visit | Attending: Surgery | Admitting: Surgery

## 2021-09-27 ENCOUNTER — Other Ambulatory Visit: Payer: Self-pay

## 2021-09-27 ENCOUNTER — Encounter (HOSPITAL_COMMUNITY): Payer: Self-pay

## 2021-09-27 DIAGNOSIS — E210 Primary hyperparathyroidism: Secondary | ICD-10-CM | POA: Insufficient documentation

## 2021-09-27 MED ORDER — TECHNETIUM TC 99M SESTAMIBI GENERIC - CARDIOLITE
25.0000 | Freq: Once | INTRAVENOUS | Status: AC | PRN
  Administered 2021-09-27: 10:00:00 23.1 via INTRAVENOUS

## 2021-09-30 NOTE — Progress Notes (Signed)
Nuclear med parathyroid study positive for left sided adenoma.  Will plan to proceed with out-patient surgery for parathyroidectomy.  Will call patient to discuss and put in orders to schedule.  Armandina Gemma, MD [facility] [place] practice Office: [phone]

## 2021-10-06 ENCOUNTER — Ambulatory Visit: Payer: Self-pay | Admitting: Surgery

## 2021-10-13 NOTE — Patient Instructions (Signed)
DUE TO COVID-19 ONLY ONE VISITOR IS ALLOWED TO COME WITH YOU AND STAY IN THE WAITING ROOM ONLY DURING PRE OP AND PROCEDURE DAY OF SURGERY IF YOU ARE GOING HOME AFTER SURGERY. IF YOU ARE SPENDING THE NIGHT 2 PEOPLE MAY VISIT WITH YOU IN YOUR PRIVATE ROOM AFTER SURGERY UNTIL VISITING  HOURS ARE OVER AT 800 PM AND 1  VISITOR  MAY  SPEND THE NIGHT.   Marland Kitchen               Melissa Garner     Your procedure is scheduled on: 11/04/21   Report to [facility] Main  Entrance   Report to admitting at  9:15 AM     Call this number if you have problems the morning of surgery [phone]    Remember: Do not eat food  :After Midnight the night before your surgery,     You may have clear liquids from midnight until 8:30 AM    CLEAR LIQUID DIET   Foods Allowed                                                                     Foods Excluded  Coffee and tea, regular and decaf                             liquids that you cannot  Plain Jell-O any favor except red or purple                                           see through such as: Fruit ices (not with fruit pulp)                                     milk, soups, orange juice  Iced Popsicles                                    All solid food Carbonated beverages, regular and diet                                    Cranberry, grape and apple juices Sports drinks like Gatorade Lightly seasoned clear broth or consume(fat free) Sugar    BRUSH YOUR TEETH MORNING OF SURGERY AND RINSE YOUR MOUTH OUT, NO CHEWING GUM CANDY OR MINTS.     Take these medicines the morning of surgery with A SIP OF WATER: Amlodipine, use your inhaler and bring it with you                                You may not have any metal on your body including hair pins and              piercings  Do not wear jewelry, make-up, lotions, powders or perfumes, deodorant  Do not wear nail polish on your fingernails.  Do not shave  48 hours prior to surgery.                 Do not bring valuables to the hospital. [place].  Contacts, dentures or bridgework may not be worn into surgery.  Leave suitcase in the car. After surgery it may be brought to your room.     Patients discharged the day of surgery will not be allowed to drive home.   IF YOU ARE HAVING SURGERY AND GOING HOME THE SAME DAY, YOU MUST HAVE AN ADULT TO DRIVE YOU HOME AND BE WITH YOU FOR 24 HOURS.  YOU MAY GO HOME BY TAXI OR UBER OR ORTHERWISE, BUT AN ADULT MUST ACCOMPANY YOU HOME AND STAY WITH YOU FOR 24 HOURS.  Name and phone number of your driver:  Special Instructions: N/A              Please read over the following fact sheets you were given: _____________________________________________________________________             [facility] - Preparing for Surgery Before surgery, you can play an important role.  Because skin is not sterile, your skin needs to be as free of germs as possible.  You can reduce the number of germs on your skin by washing with CHG (chlorahexidine gluconate) soap before surgery.  CHG is an antiseptic cleaner which kills germs and bonds with the skin to continue killing germs even after washing. Please DO NOT use if you have an allergy to CHG or antibacterial soaps.  If your skin becomes reddened/irritated stop using the CHG and inform your nurse when you arrive at Short Stay. Do not shave (including legs and underarms) for at least 48 hours prior to the first CHG shower.  Please follow these instructions carefully:  1.  Shower with CHG Soap the night before surgery and the  morning of Surgery.  2.  If you choose to wash your hair, wash your hair first as usual with your  normal  shampoo.  3.  After you shampoo, rinse your hair and body thoroughly to remove the  shampoo.                            4.  Use CHG as you would any other liquid soap.  You can apply chg directly  to the skin and wash                        Gently with a scrungie or clean washcloth.  5.  Apply the CHG Soap to your body ONLY FROM THE NECK DOWN.   Do not use on face/ open                           Wound or open sores. Avoid contact with eyes, ears mouth and genitals (private parts).                       Wash face,  Genitals (private parts) with your normal soap.             6.  Wash thoroughly, paying special attention to the area where your surgery  will be performed.  7.  Thoroughly rinse your body with warm water from the neck down.  8.  DO NOT shower/wash with your normal soap after using and rinsing off  the CHG Soap.                9.  Pat yourself dry with a clean towel.            10.  Wear clean pajamas.            11.  Place clean sheets on your bed the night of your first shower and do not  sleep with pets. Day of Surgery : Do not apply any lotions/deodorants the morning of surgery.  Please wear clean clothes to the hospital/surgery center.  FAILURE TO FOLLOW THESE INSTRUCTIONS MAY RESULT IN THE CANCELLATION OF YOUR SURGERY PATIENT SIGNATURE_________________________________  NURSE SIGNATURE__________________________________  ________________________________________________________________________

## 2021-10-14 ENCOUNTER — Other Ambulatory Visit: Payer: Self-pay

## 2021-10-14 ENCOUNTER — Encounter (HOSPITAL_COMMUNITY)
Admission: RE | Admit: 2021-10-14 | Discharge: 2021-10-14 | Disposition: A | Discharge status: Home / Self Care | Payer: Medicare Other | Source: Ambulatory Visit | Attending: Surgery | Admitting: Surgery

## 2021-10-14 ENCOUNTER — Encounter (HOSPITAL_COMMUNITY): Payer: Self-pay

## 2021-10-14 VITALS — BP 144/77 | HR 85 | Temp 98.3°F | Resp 20 | Ht 66.0 in | Wt 310.0 lb

## 2021-10-14 DIAGNOSIS — Z01818 Encounter for other preprocedural examination: Secondary | ICD-10-CM | POA: Insufficient documentation

## 2021-10-14 LAB — BASIC METABOLIC PANEL
Anion gap: 7 (ref 5–15)
BUN: 13 mg/dL (ref 8–23)
CO2: 28 mmol/L (ref 22–32)
Calcium: 10.6 mg/dL — ABNORMAL HIGH (ref 8.9–10.3)
Chloride: 104 mmol/L (ref 98–111)
Creatinine, Ser: 0.77 mg/dL (ref 0.44–1.00)
GFR, Estimated: >60 mL/min (ref >60)
Glucose, Bld: 121 mg/dL — ABNORMAL HIGH (ref 70–99)
Potassium: 3.5 mmol/L (ref 3.5–5.1)
Sodium: 139 mmol/L (ref 135–145)

## 2021-10-14 LAB — CBC
HCT: 39.6 % (ref 36.0–46.0)
Hemoglobin: 12.1 g/dL (ref 12.0–15.0)
MCH: 25.9 pg — ABNORMAL LOW (ref 26.0–34.0)
MCHC: 30.6 g/dL (ref 30.0–36.0)
MCV: 84.8 fL (ref 80.0–100.0)
Platelets: 203 10*3/uL (ref 150–400)
RBC: 4.67 MIL/uL (ref 3.87–5.11)
RDW: 13.8 % (ref 11.5–15.5)
WBC: 4.9 10*3/uL (ref 4.0–10.5)
nRBC: 0 % (ref 0.0–0.2)

## 2021-10-14 NOTE — Progress Notes (Addendum)
COVID test- NA  PCP - Dr.G.  [place] Cardiologist - Dr. Myles Gip  Chest x-ray - no EKG - 10/14/21- chart a-fib Stress Test - no ECHO - no Cardiac Cath - no Pacemaker/ICD device last checked:NA  Sleep Study - no CPAP -   Fasting Blood Sugar - NA Checks Blood Sugar _____ times a day  Blood Thinner Instructions:NA Aspirin Instructions: Last Dose:  Anesthesia review: yes  Patient denies shortness of breath, fever, cough and chest pain at PAT appointment  Pt has COPD she uses an inhaler and gets SOB with climbing stairs, doing housework and with ADLs.  Patient verbalized understanding of instructions that were given to them at the PAT appointment. Patient was also instructed that they will need to review over the PAT instructions again at home before surgery. yes

## 2021-10-18 DIAGNOSIS — Z0189 Encounter for other specified special examinations: Secondary | ICD-10-CM | POA: Diagnosis not present

## 2021-10-18 DIAGNOSIS — M150 Primary generalized (osteo)arthritis: Secondary | ICD-10-CM | POA: Diagnosis not present

## 2021-10-18 DIAGNOSIS — I4891 Unspecified atrial fibrillation: Secondary | ICD-10-CM | POA: Diagnosis not present

## 2021-10-18 DIAGNOSIS — Z0000 Encounter for general adult medical examination without abnormal findings: Secondary | ICD-10-CM | POA: Diagnosis not present

## 2021-10-18 DIAGNOSIS — E785 Hyperlipidemia, unspecified: Secondary | ICD-10-CM | POA: Diagnosis not present

## 2021-10-18 DIAGNOSIS — I10 Essential (primary) hypertension: Secondary | ICD-10-CM | POA: Diagnosis not present

## 2021-10-19 ENCOUNTER — Ambulatory Visit (INDEPENDENT_AMBULATORY_CARE_PROVIDER_SITE_OTHER): Payer: Medicare Other | Admitting: Cardiology

## 2021-10-19 ENCOUNTER — Other Ambulatory Visit: Payer: Self-pay

## 2021-10-19 ENCOUNTER — Encounter: Payer: Self-pay | Admitting: Cardiology

## 2021-10-19 VITALS — BP 142/82 | HR 88 | Ht 66.0 in | Wt 311.0 lb

## 2021-10-19 DIAGNOSIS — I4819 Other persistent atrial fibrillation: Secondary | ICD-10-CM | POA: Diagnosis not present

## 2021-10-19 DIAGNOSIS — I350 Nonrheumatic aortic (valve) stenosis: Secondary | ICD-10-CM | POA: Diagnosis not present

## 2021-10-19 DIAGNOSIS — Z01810 Encounter for preprocedural cardiovascular examination: Secondary | ICD-10-CM | POA: Diagnosis not present

## 2021-10-19 NOTE — Patient Instructions (Addendum)

## 2021-10-19 NOTE — Progress Notes (Signed)
Cardiology Office Note  Date: 10/19/2021   ID: Melissa Garner, DOB 01/17/1956, MRN [id number]  PCP:  Iona Beard, MD  Cardiologist:  Rozann Lesches, MD Electrophysiologist:  None   Chief Complaint  Patient presents with   Preoperative cardiac evaluation    History of Present Illness: Melissa Garner is a 65 y.o. female presenting to the office for preoperative cardiology consultation.  She is scheduled for a left parathyroidectomy under general anesthesia with Dr. Harlow Asa on January 5.  She was last seen in the cardiology clinic back in 2018 at which point cardiac monitor demonstrated episodes of paroxysmal atrial fibrillation.  CHA2DS2-VASc score was 2 at that time however she declined anticoagulation.  She also had mild aortic stenosis by echocardiogram in 2017.  More recently she has had persistent atrial fibrillation that is largely asymptomatic and rate controlled without AV nodal blockers.  I reviewed her recent ECG.  CHA2DS2-VASc score is 3 at this time, and she was just started on Eliquis by her PCP yesterday.  RCRI perioperative cardiac risk calculator indicates class I, 0.4% chance of major adverse cardiac event.  Past Medical History:  Diagnosis Date   Aortic stenosis    COPD (chronic obstructive pulmonary disease) (HCC)    Essential hypertension    Hyperlipidemia    Lumbar disc disease    Left leg pain   Obesity    Osteoarthritis    Parathyroid abnormality (HCC)    Paroxysmal atrial fibrillation ([place])     Past Surgical History:  Procedure Laterality Date   ABDOMINAL HYSTERECTOMY  1989   Partial   CARPAL TUNNEL RELEASE Bilateral 2005   COLONOSCOPY N/A 01/14/2016   Procedure: COLONOSCOPY;  Surgeon: Danie Binder, MD;  [facility];  Service: Endoscopy;  Laterality: N/A;  230    KNEE SURGERY Left 2008   total    Current Outpatient Medications  Medication Sig Dispense Refill   acetaminophen (TYLENOL) 500 MG tablet Take 1,000 mg by  mouth every 8 (eight) hours as needed for moderate pain.     albuterol (VENTOLIN HFA) 108 (90 Base) MCG/ACT inhaler Inhale 2 puffs into the lungs daily.     amLODipine (NORVASC) 10 MG tablet Take 1 tablet (10 mg total) by mouth daily. 30 tablet 0   apixaban (ELIQUIS) 5 MG TABS tablet Take 5 mg by mouth 2 (two) times daily.     atorvastatin (LIPITOR) 20 MG tablet Take 20 mg by mouth daily.     Cholecalciferol 50 MCG (2000 UT) CAPS Take 1 capsule (2,000 Units total) by mouth daily with breakfast. 90 capsule 1   clobetasol cream (TEMOVATE) 0.94 % Apply 1 application topically daily as needed (breakouts).     diclofenac sodium (VOLTAREN) 1 % GEL Apply 1 application topically daily as needed (pain).     lisinopril-hydrochlorothiazide (PRINZIDE,ZESTORETIC) 20-12.5 MG tablet Take 1 tablet by mouth daily. 30 tablet 0   meloxicam (MOBIC) 7.5 MG tablet Take 1 tablet (7.5 mg total) by mouth daily. 30 tablet 5   No current facility-administered medications for this visit.   Allergies:  Patient has no known allergies.   Social History: The patient  reports that she has never smoked. She has never used smokeless tobacco. She reports that she does not drink alcohol and does not use drugs.   Family History: The patient's family history includes Alcoholism in her father; Arthritis in her brother, brother, brother, and sister; COPD in her sister and sister; Cancer in her father; Crohn's disease in  her son; Heart failure in her mother; Hypertension in her brother, brother, brother, daughter, sister, sister, sister, and son; Seizures in her sister.   ROS: Rare sense of occasional palpitations.  No dizziness or syncope.  Physical Exam: VS:  BP (!) 142/82    Pulse 88    Ht 5\' 6"  (1.676 m)    Wt (!) 311 lb (141.1 kg)    SpO2 99%    BMI 50.20 kg/m , BMI Body mass index is 50.2 kg/m.  Wt Readings from Last 3 Encounters:  10/19/21 (!) 311 lb (141.1 kg)  10/14/21 (!) 310 lb (140.6 kg)  06/22/21 (!) 305 lb 9.6 oz  (138.6 kg)    General: Patient appears comfortable at rest. HEENT: Conjunctiva and lids normal, wearing a mask. Neck: Supple, no elevated JVP or carotid bruits, no thyromegaly. Lungs: Clear to auscultation, nonlabored breathing at rest. Cardiac: Irregularly irregular, no S3, 2/6 systolic murmur, no pericardial rub. Abdomen: Soft, nontender, bowel sounds present. Extremities: No pitting edema, distal pulses 2+. Skin: Warm and dry. Musculoskeletal: No kyphosis. Neuropsychiatric: Alert and oriented x3, affect grossly appropriate.  ECG:  An ECG dated 10/14/2021 was personally reviewed today and demonstrated:  Atrial fibrillation with controlled ventricular response and nonspecific T wave changes.  Recent Labwork: 02/15/2021: Magnesium 1.8 06/16/2021: TSH 1.250 10/14/2021: BUN 13; Creatinine, Ser 0.77; Hemoglobin 12.1; Platelets 203; Potassium 3.5; Sodium 139   Other Studies Reviewed Today:  Echocardiogram 10/26/2016: - Left ventricle: The cavity size was normal. Wall thickness was    normal. Systolic function was normal. The estimated ejection    fraction was in the range of 55% to 60%. Features are consistent    with a pseudonormal left ventricular filling pattern, with    concomitant abnormal relaxation and increased filling pressure    (grade 2 diastolic dysfunction). Doppler parameters are    consistent with high ventricular filling pressure.  - Aortic valve: Mildly calcified annulus. Trileaflet; mildly    thickened leaflets. There was mild stenosis. Mean gradient (S):    11 mm Hg. Valve area (VTI): 1.58 cm^2. Valve area (Vmax): 1.56    cm^2. Valve area (Vmean): 1.65 cm^2.  - Left atrium: The atrium was moderately dilated.  - Atrial septum: No defect or patent foramen ovale was identified.  - Pulmonary arteries: Systolic pressure was mildly increased. PA    peak pressure: 38 mm Hg (S).  - Technically difficult study.   Assessment and Plan:  1.  Persistent atrial fibrillation  of uncertain duration with prior history of PAF as discussed above.  Heart rate is controlled in the absence of AV nodal blockers she was just started on Eliquis for stroke prophylaxis by Dr. Berdine Addison with CHA2DS2-VASc score of 3.  I reviewed her recent lab work.  2.  History of mild calcific aortic stenosis by echocardiogram in 2017.  We will plan an updated study for follow-up.  3.  Preoperative cardiac evaluation prior to planned left parathyroidectomy under general anesthesia on January 5.  RCRI cardiac risk calculator indicates low perioperative cardiovascular risk.  Other than the echocardiogram discussed above, no further cardiac testing is planned at this time.  She would need to stop her Eliquis 72 hours prior to surgery.  Medication Adjustments/Labs and Tests Ordered: Current medicines are reviewed at length with the patient today.  Concerns regarding medicines are outlined above.   Tests Ordered: Orders Placed This Encounter  Procedures   ECHOCARDIOGRAM COMPLETE    Medication Changes: No orders of the defined types were  placed in this encounter.   Disposition:  Follow up  6 months.  Signed, Satira Sark, MD, [facility] 10/19/2021 3:18 PM    [facility] [place], [place], [place] [address] Phone: [phone]; Fax: [phone]

## 2021-10-21 ENCOUNTER — Ambulatory Visit (INDEPENDENT_AMBULATORY_CARE_PROVIDER_SITE_OTHER): Payer: Medicare Other

## 2021-10-21 ENCOUNTER — Telehealth: Payer: Self-pay | Admitting: *Deleted

## 2021-10-21 DIAGNOSIS — I4819 Other persistent atrial fibrillation: Secondary | ICD-10-CM

## 2021-10-21 DIAGNOSIS — I350 Nonrheumatic aortic (valve) stenosis: Secondary | ICD-10-CM

## 2021-10-21 LAB — ECHOCARDIOGRAM COMPLETE
AR max vel: 1.39 cm2
AV Area VTI: 1.47 cm2
AV Area mean vel: 1.41 cm2
AV Mean grad: 7 mmHg
AV Peak grad: 13.1 mmHg
Ao pk vel: 1.81 m/s
Area-P 1/2: 4.36 cm2
S' Lateral: 4.75 cm

## 2021-10-21 NOTE — Telephone Encounter (Signed)
Patient informed. Copy sent to PCP °

## 2021-10-21 NOTE — Telephone Encounter (Signed)
-----   Message from Satira Sark, MD sent at 10/21/2021  1:08 PM EST ----- Results reviewed.  LVEF is normal at 55% and aortic stenosis stable in mild range.  This does not adversely affect her surgical risk.  She should be able to proceed as discussed in the recent office note.

## 2021-10-22 NOTE — Progress Notes (Signed)
Anesthesia Chart Review   Case: [id number] Date/Time: 11/04/21 1115   Procedure: LEFT PARATHYROIDECTOMY (Left)   Anesthesia type: General   Pre-op diagnosis: PRIMARY HYPERPARATHYROIDISM   Location: [facility] ROOM 01 / [facility]   Surgeons: Armandina Gemma, MD       DISCUSSION:65 y.o. never smoker with h/o HTN, COPD, mild AS (valve area 1.47 cm2, mean gradient 7.0 mmHg), PAF, primary hyperparathyroidism scheduled for above procedure 11/04/2021 with Dr. Armandina Gemma.   Pt seen by cardiology 10/19/2021. Per OV note, "Preoperative cardiac evaluation prior to planned left parathyroidectomy under general anesthesia on January 5.  RCRI cardiac risk calculator indicates low perioperative cardiovascular risk.  Other than the echocardiogram discussed above, no further cardiac testing is planned at this time.  She would need to stop her Eliquis 72 hours prior to surgery."  Anticipate pt can proceed with planned procedure barring acute status change.   VS: BP (!) 144/77    Pulse 85    Temp 36.8 C (Oral)    Resp 20    Ht 5\' 6"  (1.676 m)    Wt (!) 140.6 kg    SpO2 97%    BMI 50.04 kg/m   PROVIDERS: Iona Beard, MD is Pcp   Rozann Lesches, MD is Cardiologist  LABS: Labs reviewed: Acceptable for surgery. (all labs ordered are listed, but only abnormal results are displayed)  Labs Reviewed  BASIC METABOLIC PANEL - Abnormal; Notable for the following components:      Result Value   Glucose, Bld 121 (*)    Calcium 10.6 (*)    All other components within normal limits  CBC - Abnormal; Notable for the following components:   MCH 25.9 (*)    All other components within normal limits     IMAGES:   EKG: 10/14/2021 Rate 83 bpm  Atrial fibrillation    CV: Echo 10/21/21  1. Left ventricular ejection fraction, by estimation, is approximately  55%. The left ventricle has normal function. Left ventricular endocardial  border not optimally defined to evaluate regional wall motion. Left  ventricular diastolic  parameters are  indeterminate.   2. Right ventricular systolic function is normal. The right ventricular  size is normal. Tricuspid regurgitation signal is inadequate for assessing  PA pressure.   3. The mitral valve is grossly normal. Trivial mitral valve  regurgitation.   4. The aortic valve is tricuspid. There is mild calcification of the  aortic valve. Aortic valve regurgitation is not visualized. Mild aortic  valve stenosis. Aortic valve mean gradient measures 7.0 mmHg.  Dimentionless index 0.52.   5. The inferior vena cava is normal in size with greater than 50%  respiratory variability, suggesting right atrial pressure of 3 mmHg. Past Medical History:  Diagnosis Date   Aortic stenosis    COPD (chronic obstructive pulmonary disease) (HCC)    Essential hypertension    Hyperlipidemia    Lumbar disc disease    Left leg pain   Obesity    Osteoarthritis    Parathyroid abnormality (HCC)    Paroxysmal atrial fibrillation ([place])     Past Surgical History:  Procedure Laterality Date   ABDOMINAL HYSTERECTOMY  1989   Partial   CARPAL TUNNEL RELEASE Bilateral 2005   COLONOSCOPY N/A 01/14/2016   Procedure: COLONOSCOPY;  Surgeon: Danie Binder, MD;  [facility];  Service: Endoscopy;  Laterality: N/A;  230    KNEE SURGERY Left 2008   total    MEDICATIONS:  acetaminophen (TYLENOL) 500 MG tablet  albuterol (VENTOLIN HFA) 108 (90 Base) MCG/ACT inhaler   amLODipine (NORVASC) 10 MG tablet   apixaban (ELIQUIS) 5 MG TABS tablet   atorvastatin (LIPITOR) 20 MG tablet   Cholecalciferol 50 MCG (2000 UT) CAPS   clobetasol cream (TEMOVATE) 0.05 %   diclofenac sodium (VOLTAREN) 1 % GEL   lisinopril-hydrochlorothiazide (PRINZIDE,ZESTORETIC) 20-12.5 MG tablet   meloxicam (MOBIC) 7.5 MG tablet   No current facility-administered medications for this encounter.    Konrad Felix Ward, PA-C [facility] [phone]

## 2021-10-22 NOTE — Anesthesia Preprocedure Evaluation (Addendum)
Anesthesia Evaluation  Patient identified by MRN, date of birth, ID band Patient awake    Reviewed: Allergy & Precautions, NPO status , Patient's Chart, lab work & pertinent test results  Airway Mallampati: II  TM Distance: >3 FB Neck ROM: Full    Dental no notable dental hx. (+) Edentulous Upper, Edentulous Lower   Pulmonary COPD,  COPD inhaler,    Pulmonary exam normal breath sounds clear to auscultation       Cardiovascular hypertension, Pt. on medications Normal cardiovascular exam+ dysrhythmias Atrial Fibrillation + Valvular Problems/Murmurs (Mild aortic Stenosis area 1.47) AS  Rhythm:Irregular Rate:Abnormal  EKG afib R 83  10/21/21 Echo Left Ventricle: Left ventricular ejection fraction, by estimation, is  55%. The left ventricle has normal function. Left ventricular endocardial  border not optimally defined to evaluate regional wall motion. The left  ventricular internal cavity size was  normal in size. There is no left ventricular hypertrophy. Left ventricular  diastolic function could not be evaluated due to atrial fibrillation. Left  ventricular diastolic parameters are indeterminate.   Right Ventricle: The right ventricular size is normal. No increase in  right ventricular wall thickness. Right ventricular systolic function is  normal. Tricuspid regurgitation signal is inadequate for assessing PA  pressure.   Left Atrium: Left atrial size was normal in size.   Right Atrium: Right atrial size was normal in size.   Pericardium: There is no evidence of pericardial effusion.   Mitral Valve: The mitral valve is grossly normal. Trivial mitral valve  regurgitation.   Tricuspid Valve: The tricuspid valve is grossly normal. Tricuspid valve  regurgitation is trivial.   Aortic Valve: The aortic valve is tricuspid. There is mild calcification  of the aortic valve. There is mild to moderate aortic valve annular   calcification. Aortic valve regurgitation is not visualized.    Neuro/Psych    GI/Hepatic   Endo/Other  Morbid obesity (BMI 49.55)  Renal/GU      Musculoskeletal  (+) Arthritis ,   Abdominal (+) + obese,   Peds  Hematology   Anesthesia Other Findings   Reproductive/Obstetrics                          Anesthesia Physical Anesthesia Plan  ASA: 3  Anesthesia Plan: General   Post-op Pain Management:    Induction:   PONV Risk Score and Plan: 4 or greater and Treatment may vary due to age or medical condition, Midazolam, Ondansetron and Dexamethasone  Airway Management Planned: Oral ETT  Additional Equipment:   Intra-op Plan:   Post-operative Plan: Extubation in OR  Informed Consent: I have reviewed the patients History and Physical, chart, labs and discussed the procedure including the risks, benefits and alternatives for the proposed anesthesia with the patient or authorized representative who has indicated his/her understanding and acceptance.     Dental advisory given  Plan Discussed with: CRNA and Anesthesiologist  Anesthesia Plan Comments: (See PAT note 10/14/2021, Konrad Felix Ward, PA-C)      Anesthesia Quick Evaluation

## 2021-10-29 ENCOUNTER — Other Ambulatory Visit: Payer: Self-pay

## 2021-10-29 ENCOUNTER — Encounter: Payer: Self-pay | Admitting: Emergency Medicine

## 2021-10-29 ENCOUNTER — Ambulatory Visit
Admission: EM | Admit: 2021-10-29 | Discharge: 2021-10-29 | Disposition: A | Discharge status: Home / Self Care | Payer: Medicare Other | Attending: Family Medicine | Admitting: Family Medicine

## 2021-10-29 DIAGNOSIS — J069 Acute upper respiratory infection, unspecified: Secondary | ICD-10-CM

## 2021-10-29 DIAGNOSIS — M1711 Unilateral primary osteoarthritis, right knee: Secondary | ICD-10-CM | POA: Diagnosis not present

## 2021-10-29 DIAGNOSIS — R112 Nausea with vomiting, unspecified: Secondary | ICD-10-CM

## 2021-10-29 DIAGNOSIS — Z9189 Other specified personal risk factors, not elsewhere classified: Secondary | ICD-10-CM | POA: Diagnosis not present

## 2021-10-29 DIAGNOSIS — J441 Chronic obstructive pulmonary disease with (acute) exacerbation: Secondary | ICD-10-CM

## 2021-10-29 DIAGNOSIS — I10 Essential (primary) hypertension: Secondary | ICD-10-CM | POA: Diagnosis not present

## 2021-10-29 DIAGNOSIS — E785 Hyperlipidemia, unspecified: Secondary | ICD-10-CM | POA: Diagnosis not present

## 2021-10-29 MED ORDER — DEXAMETHASONE SODIUM PHOSPHATE 10 MG/ML IJ SOLN
10.0000 mg | Freq: Once | INTRAMUSCULAR | Status: AC
  Administered 2021-10-29: 18:00:00 10 mg via INTRAMUSCULAR

## 2021-10-29 MED ORDER — ONDANSETRON 4 MG PO TBDP: 4.0000 mg | ORAL_TABLET | Freq: Three times a day (TID) | ORAL | 0 refills | Status: AC | PRN

## 2021-10-29 NOTE — ED Provider Notes (Signed)
[facility]    CSN: [id number] Arrival date & time: 10/29/21  1615      History   Chief Complaint No chief complaint on file.   HPI Melissa Garner is a 65 y.o. female.   Presenting today with 4-day history of worsening fever, body aches, sore throat, productive cough, wheezing, diarrhea, nausea, vomiting.  Denies chest pain, significant shortness of breath, rashes, abdominal pain.  Taking her albuterol inhaler for COPD as needed as well as taking over-the-counter cold and congestion medication with mild temporary relief of symptoms.  Multiple sick contacts in the home with similar symptoms.   Past Medical History:  Diagnosis Date   Aortic stenosis    COPD (chronic obstructive pulmonary disease) (HCC)    Essential hypertension    Hyperlipidemia    Lumbar disc disease    Left leg pain   Obesity    Osteoarthritis    Parathyroid abnormality (HCC)    Paroxysmal atrial fibrillation [facility])     Patient Active Problem List   Diagnosis Date Noted   Vitamin D deficiency 02/17/2021   Encounter for screening fecal occult blood testing 06/02/2020   Encounter for well woman exam with routine gynecological exam 06/02/2020   Rectocele 06/02/2020   Multinodular goiter 07/12/2019   Hypercalcemia 06/26/2019   Other hyperparathyroidism ([place]) 06/26/2019   Special screening for malignant neoplasms, colon    Encounter for screening colonoscopy 12/30/2015   Constipation 12/30/2015   HYPERLIPIDEMIA-MIXED 11/30/2009   Unspecified essential hypertension 11/30/2009   DYSPNEA ON EXERTION 11/30/2009   MORBID OBESITY 04/30/2009   KNEE, ARTHRITIS, DEGEN./OSTEO 04/27/2009    Past Surgical History:  Procedure Laterality Date   ABDOMINAL HYSTERECTOMY  1989   Partial   CARPAL TUNNEL RELEASE Bilateral 2005   COLONOSCOPY N/A 01/14/2016   Procedure: COLONOSCOPY;  Surgeon: Danie Binder, MD;  [facility];  Service: Endoscopy;  Laterality: N/A;  35    KNEE SURGERY Left  2008   total    OB History     Gravida  3   Para  3   Term  3   Preterm      AB      Living  3      SAB      IAB      Ectopic      Multiple      Live Births               Home Medications    Prior to Admission medications   Medication Sig Start Date End Date Taking? Authorizing Provider  ondansetron (ZOFRAN-ODT) 4 MG disintegrating tablet Take 1 tablet (4 mg total) by mouth every 8 (eight) hours as needed for nausea or vomiting. 10/29/21  Yes Volney American, PA-C  acetaminophen (TYLENOL) 500 MG tablet Take 1,000 mg by mouth every 8 (eight) hours as needed for moderate pain.    [provider]  albuterol (VENTOLIN HFA) 108 (90 Base) MCG/ACT inhaler Inhale 2 puffs into the lungs daily. 04/08/19   [provider]  amLODipine (NORVASC) 10 MG tablet Take 1 tablet (10 mg total) by mouth daily. 10/04/18   Triplett, Tammy, PA-C  apixaban (ELIQUIS) 5 MG TABS tablet Take 5 mg by mouth 2 (two) times daily.    [provider]  atorvastatin (LIPITOR) 20 MG tablet Take 20 mg by mouth daily. 06/15/20   [provider]  Cholecalciferol 50 MCG (2000 UT) CAPS Take 1 capsule (2,000 Units total) by mouth daily  with breakfast. 02/17/21   Cassandria Anger, MD  clobetasol cream (TEMOVATE) 8.12 % Apply 1 application topically daily as needed (breakouts). 07/29/20   [provider]  diclofenac sodium (VOLTAREN) 1 % GEL Apply 1 application topically daily as needed (pain). 01/22/19   [provider]  lisinopril-hydrochlorothiazide (PRINZIDE,ZESTORETIC) 20-12.5 MG tablet Take 1 tablet by mouth daily. 10/04/18   Triplett, Tammy, PA-C  meloxicam (MOBIC) 7.5 MG tablet Take 1 tablet (7.5 mg total) by mouth daily. 06/07/21   Carole Civil, MD    Family History Family History  Problem Relation Age of Onset   Heart failure Mother    Alcoholism Father    Cancer Father    COPD Sister    Hypertension Sister    Arthritis Sister     Seizures Sister    Hypertension Brother    Arthritis Brother    Hypertension Brother    Arthritis Brother    Hypertension Brother    Arthritis Brother    Hypertension Sister    Hypertension Sister    COPD Sister    Hypertension Daughter    Hypertension Son    Crohn's disease Son    Colon cancer Neg Hx     Social History Social History   Tobacco Use   Smoking status: Never   Smokeless tobacco: Never  Vaping Use   Vaping Use: Never used  Substance Use Topics   Alcohol use: No    Alcohol/week: 0.0 standard drinks   Drug use: No     Allergies   Patient has no known allergies.   Review of Systems Review of Systems Per HPI  Physical Exam Triage Vital Signs ED Triage Vitals  Enc Vitals Group     BP 10/29/21 1724 135/88     Pulse Rate 10/29/21 1724 77     Resp 10/29/21 1724 18     Temp 10/29/21 1724 98.4 F (36.9 C)     Temp Source 10/29/21 1724 Oral     SpO2 10/29/21 1724 96 %     Weight --      Height --      Head Circumference --      Peak Flow --      Pain Score 10/29/21 1725 0     Pain Loc --      Pain Edu? --      Excl. in [place]? --    No data found.  Updated Vital Signs BP 135/88 (BP Location: Right Arm)    Pulse 77    Temp 98.4 F (36.9 C) (Oral)    Resp 18    SpO2 96%   Visual Acuity Right Eye Distance:   Left Eye Distance:   Bilateral Distance:    Right Eye Near:   Left Eye Near:    Bilateral Near:     Physical Exam Vitals and nursing note reviewed.  Constitutional:      Appearance: Normal appearance.  HENT:     Head: Atraumatic.     Right Ear: Tympanic membrane and external ear normal.     Left Ear: Tympanic membrane and external ear normal.     Nose: Rhinorrhea present.     Mouth/Throat:     Mouth: Mucous membranes are moist.     Pharynx: Posterior oropharyngeal erythema present.  Eyes:     Extraocular Movements: Extraocular movements intact.     Conjunctiva/sclera: Conjunctivae normal.  Cardiovascular:     Rate and Rhythm:  Normal rate and regular rhythm.  Heart sounds: Normal heart sounds.  Pulmonary:     Effort: Pulmonary effort is normal.     Breath sounds: Wheezing present. No rales.     Comments: Mild diffuse wheezes bilaterally Abdominal:     General: Bowel sounds are normal. There is no distension.     Palpations: Abdomen is soft.     Tenderness: There is no abdominal tenderness. There is no right CVA tenderness, left CVA tenderness or guarding.  Musculoskeletal:        General: Normal range of motion.     Cervical back: Normal range of motion and neck supple.  Skin:    General: Skin is warm and dry.  Neurological:     Mental Status: She is alert and oriented to person, place, and time.  Psychiatric:        Mood and Affect: Mood normal.        Thought Content: Thought content normal.     UC Treatments / Results  Labs (all labs ordered are listed, but only abnormal results are displayed) Labs Reviewed  COVID-19, FLU A+B NAA    EKG   Radiology No results found.  Procedures Procedures (including critical care time)  Medications Ordered in UC Medications  dexamethasone (DECADRON) injection 10 mg (has no administration in time range)    Initial Impression / Assessment and Plan / UC Course  I have reviewed the triage vital signs and the nursing notes.  Pertinent labs & imaging results that were available during my care of the patient were reviewed by me and considered in my medical decision making (see chart for details).      Suspect viral illness causing symptoms, will treat with IM Decadron for COPD exacerbation, albuterol as needed, Zofran for nausea and vomiting, cold and congestion medications.  Supportive home care and return precautions reviewed.  Final Clinical Impressions(s) / UC Diagnoses   Final diagnoses:  At increased risk of exposure to COVID-19 virus  Viral URI with cough  Nausea and vomiting, unspecified vomiting type  COPD exacerbation [facility])   Discharge  Instructions   None    ED Prescriptions     Medication Sig Dispense Auth. Provider   ondansetron (ZOFRAN-ODT) 4 MG disintegrating tablet Take 1 tablet (4 mg total) by mouth every 8 (eight) hours as needed for nausea or vomiting. 20 tablet Volney American, [place]      PDMP not reviewed this encounter.   Volney American, [place] 10/29/21 [phone]

## 2021-10-29 NOTE — ED Triage Notes (Signed)
Productive cough - green sputum.  Symptoms started 12/26 with sore throat and cough, fever.  Symptoms have become worse

## 2021-10-30 LAB — COVID-19, FLU A+B NAA
Influenza A, NAA: NOT DETECTED
Influenza B, NAA: NOT DETECTED
SARS-CoV-2, NAA: NOT DETECTED

## 2021-11-25 NOTE — Patient Instructions (Addendum)
DUE TO COVID-19 ONLY ONE VISITOR IS ALLOWED TO COME WITH YOU AND STAY IN THE WAITING ROOM ONLY DURING PRE OP AND PROCEDURE.   **NO VISITORS ARE ALLOWED IN THE SHORT STAY AREA OR RECOVERY ROOM!!**  You are not required to quarantine, however you are required to wear a well-fitted mask when you are out and around people not in your household.  Hand Hygiene often Do NOT share personal items Notify your provider if you are in close contact with someone who has COVID or you develop fever 100.4 or greater, new onset of sneezing, cough, sore throat, shortness of breath or body aches.        Your procedure is scheduled on: Thursday, 12-02-21   Report to [facility] Main  Entrance     Report to admitting at 5:15 AM   Call this number if you have problems the morning of surgery [phone]   Do not eat food :After Midnight.   May have liquids until 4:30 AM day of surgery  CLEAR LIQUID DIET  Foods Allowed                                                                     Foods Excluded  Water, Black Coffee (no milk/no creamer) and tea, regular and decaf                              liquids that you cannot  Plain Jell-O in any flavor  (No red)                         see through such as: Fruit ices (not with fruit pulp)                                 milk, soups, orange juice  Iced Popsicles (No red)                                    All solid food                             Apple juices Sports drinks like Gatorade (No red) Lightly seasoned clear broth or consume(fat free) Sugar     Oral Hygiene is also important to reduce your risk of infection.                                    Remember - BRUSH YOUR TEETH THE MORNING OF SURGERY WITH YOUR REGULAR TOOTHPASTE   Do NOT smoke after Midnight   Take these medicines the morning of surgery with A SIP OF WATER: Tylenol, Amlodipine, Atorvastatin, Ondansetron if needed                    Eliquis - stop three days prior to  surgery   Stop all vitamins and herbal supplements a week before surgery  You may not have any metal on your body including hair pins, jewelry, and body piercing             Do not wear make-up, lotions, powders, perfumes or deodorant  Do not wear nail polish including gel and S&S, artificial/acrylic nails, or any other type of covering on natural nails including finger and toenails. If you have artificial nails, gel coating, etc. that needs to be removed by a nail salon please have this removed prior to surgery or surgery may need to be canceled/ delayed if the surgeon/ anesthesia feels like they are unable to be safely monitored.   Do not shave  48 hours prior to surgery.   Do not bring valuables to the hospital. [place].   Contacts, dentures or bridgework may not be worn into surgery.    Patients discharged the day of surgery will not be allowed to drive home.   A responsible adult must remain with you for 24 hours after surgery.  Please read over the following fact sheets you were given: IF YOU HAVE QUESTIONS ABOUT YOUR PRE OP INSTRUCTIONS PLEASE CALL [place] - Preparing for Surgery Before surgery, you can play an important role.  Because skin is not sterile, your skin needs to be as free of germs as possible.  You can reduce the number of germs on your skin by washing with CHG (chlorahexidine gluconate) soap before surgery.  CHG is an antiseptic cleaner which kills germs and bonds with the skin to continue killing germs even after washing. Please DO NOT use if you have an allergy to CHG or antibacterial soaps.  If your skin becomes reddened/irritated stop using the CHG and inform your nurse when you arrive at Short Stay. Do not shave (including legs and underarms) for at least 48 hours prior to the first CHG shower.  You may shave your face/neck.  Please follow these instructions carefully:  1.  Shower with CHG  Soap the night before surgery and the  morning of surgery.  2.  If you choose to wash your hair, wash your hair first as usual with your normal  shampoo.  3.  After you shampoo, rinse your hair and body thoroughly to remove the shampoo.                             4.  Use CHG as you would any other liquid soap.  You can apply chg directly to the skin and wash.  Gently with a scrungie or clean washcloth.  5.  Apply the CHG Soap to your body ONLY FROM THE NECK DOWN.   Do   not use on face/ open                           Wound or open sores. Avoid contact with eyes, ears mouth and   genitals (private parts).                       Wash face,  Genitals (private parts) with your normal soap.             6.  Wash thoroughly, paying special attention to the area where your    surgery  will be performed.  7.  Thoroughly rinse your body with warm water from the neck down.  8.  DO  NOT shower/wash with your normal soap after using and rinsing off the CHG Soap.                9.  Pat yourself dry with a clean towel.            10.  Wear clean pajamas.            11.  Place clean sheets on your bed the night of your first shower and do not  sleep with pets. Day of Surgery : Do not apply any lotions/deodorants the morning of surgery.  Please wear clean clothes to the hospital/surgery center.  FAILURE TO FOLLOW THESE INSTRUCTIONS MAY RESULT IN THE CANCELLATION OF YOUR SURGERY  PATIENT SIGNATURE_________________________________  NURSE SIGNATURE__________________________________  ________________________________________________________________________

## 2021-11-25 NOTE — Progress Notes (Addendum)
COVID swab appointment: N/A  COVID Vaccine Completed:  Yes x2 Date COVID Vaccine completed: Has received booster:  Yes x1 COVID vaccine manufacturer:  Moderna     Date of COVID positive in last 90 days:  No  PCP - Iona Beard, MD Cardiologist - Rozann Lesches, MD  Cardiac clearance in note dated 10-19-21 by Rozann Lesches, MD (surgery was initially 11-04-21)  Chest x-ray - N/A EKG - 10-14-21 Epic Stress Test - greater than 2 years ECHO - 10-21-21 Epic Cardiac Cath - N/A Pacemaker/ICD device last checked: Spinal Cord Stimulator: Holter Monitor - 2017 Epic  Sleep Study - N/A CPAP -   Fasting Blood Sugar - N/A Checks Blood Sugar _____ times a day  Blood Thinner Instructions: Eliquis. Hold x3 days.  Patient is aware Aspirin Instructions: Last Dose:  Activity level:   Can go up a flight of stairs and perform activities of daily living without stopping and without symptoms of chest pain.  Patient does have  shortness of breath with exertion but resolves when she sits and rests. .  She does not feel that the shortness of breath has worsened.      Anesthesia review:  Afib, Aortic stenosis, COPD, HTN  Surgery rescheduled from 11-04-21 to 12-02-21 due to upper respiratory infection.  States that all symptoms have resolved and she is back to her baseline.  Patient denies shortness of breath, fever, cough and chest pain at PAT appointment  Patient verbalized understanding of instructions that were given to them at the PAT appointment. Patient was also instructed that they will need to review over the PAT instructions again at home before surgery.

## 2021-11-26 ENCOUNTER — Encounter (HOSPITAL_COMMUNITY): Payer: Self-pay | Admitting: Surgery

## 2021-11-26 ENCOUNTER — Other Ambulatory Visit: Payer: Self-pay

## 2021-11-26 ENCOUNTER — Encounter (HOSPITAL_COMMUNITY): Payer: Self-pay

## 2021-11-26 ENCOUNTER — Encounter (HOSPITAL_COMMUNITY)
Admission: RE | Admit: 2021-11-26 | Discharge: 2021-11-26 | Disposition: A | Discharge status: Home / Self Care | Payer: Medicare Other | Source: Ambulatory Visit | Attending: Surgery | Admitting: Surgery

## 2021-11-26 VITALS — BP 150/78 | HR 69 | Temp 98.2°F | Resp 20 | Ht 66.0 in | Wt 307.0 lb

## 2021-11-26 DIAGNOSIS — Z01818 Encounter for other preprocedural examination: Secondary | ICD-10-CM

## 2021-11-26 DIAGNOSIS — I2510 Atherosclerotic heart disease of native coronary artery without angina pectoris: Secondary | ICD-10-CM | POA: Diagnosis not present

## 2021-11-26 DIAGNOSIS — Z01812 Encounter for preprocedural laboratory examination: Secondary | ICD-10-CM | POA: Insufficient documentation

## 2021-11-26 HISTORY — DX: Acute upper respiratory infection, unspecified: J06.9

## 2021-11-26 LAB — CBC
HCT: 40.2 % (ref 36.0–46.0)
Hemoglobin: 12.3 g/dL (ref 12.0–15.0)
MCH: 26.2 pg (ref 26.0–34.0)
MCHC: 30.6 g/dL (ref 30.0–36.0)
MCV: 85.5 fL (ref 80.0–100.0)
Platelets: 198 10*3/uL (ref 150–400)
RBC: 4.7 MIL/uL (ref 3.87–5.11)
RDW: 14.1 % (ref 11.5–15.5)
WBC: 5.3 10*3/uL (ref 4.0–10.5)
nRBC: 0 % (ref 0.0–0.2)

## 2021-11-26 LAB — BASIC METABOLIC PANEL
Anion gap: 6 (ref 5–15)
BUN: 15 mg/dL (ref 8–23)
CO2: 28 mmol/L (ref 22–32)
Calcium: 10.7 mg/dL — ABNORMAL HIGH (ref 8.9–10.3)
Chloride: 105 mmol/L (ref 98–111)
Creatinine, Ser: 0.74 mg/dL (ref 0.44–1.00)
GFR, Estimated: >60 mL/min (ref >60)
Glucose, Bld: 117 mg/dL — ABNORMAL HIGH (ref 70–99)
Potassium: 3.8 mmol/L (ref 3.5–5.1)
Sodium: 139 mmol/L (ref 135–145)

## 2021-11-26 NOTE — H&P (Signed)
REFERRING PHYSICIAN: Loni Beckwith, MD  PROVIDER: Zan Orlick Charlotta Newton, MD  Chief Complaint: New Consultation (hyperparathyroidism)   History of Present Illness:  Patient is a 66 year old female referred by Dr. Glade Lloyd for surgical evaluation and management of primary hyperparathyroidism. Patient had been noted proximately 2 years ago to have elevated serum calcium levels. She complained of neck discomfort. She is also noted fatigue as well as bone and joint pain. She has frequent urination. Laboratory studies revealed an elevated serum calcium level. Her recent high level was 11.0 and the highest intact PTH level measured was 175. Patient was treated for vitamin D deficiency. Her most recent 25 hydroxy vitamin D level was normal at 42.7. 24-hour urine collection for calcium last spring was normal at 172. Patient has had no prior head or neck surgery. She has had no history of thyroid disease. There is no family history of parathyroid disease. There is a family history of thyroid cancer in the patient's son. He required surgery but is alive and doing well. Patient is retired. 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  Review of Systems: A complete review of systems was obtained from the patient. I have reviewed this information and discussed as appropriate with the patient. See HPI as well for other ROS.  Review of Systems  Constitutional: Positive for malaise/fatigue.  HENT: Negative.  Eyes: Negative.  Respiratory: Negative.  Cardiovascular: Negative.  Gastrointestinal: Negative.  Genitourinary: Negative.  Musculoskeletal: Positive for joint pain.  Skin: Negative.  Neurological: Negative.  Endo/Heme/Allergies: Negative.  Psychiatric/Behavioral: Negative.    Medical History: Past Medical History:  Diagnosis Date   Anxiety   Arthritis   Asthma, unspecified asthma severity, unspecified whether complicated, unspecified whether  persistent   COPD (chronic obstructive pulmonary disease) (CMS-HCC)   Hypertension   Patient Active Problem List  Diagnosis   Primary hyperparathyroidism (CMS-HCC)   Past Surgical History:  Procedure Laterality Date   HYSTERECTOMY   JOINT REPLACEMENT    No Known Allergies  Current Outpatient Medications on File Prior to Visit  Medication Sig Dispense Refill   celecoxib (CELEBREX) 200 MG capsule Take by mouth   cholecalciferol (VITAMIN D3) 2,000 unit capsule Take by mouth   triamcinolone 0.1 % cream   acetaminophen (TYLENOL) 325 MG tablet Take by mouth   albuterol 90 mcg/actuation inhaler INHALE 2 PUFFS BY MOUTH EVERY 4 HOURS AS NEEDED FOR SHORTNESS OF BREATH   amLODIPine (NORVASC) 10 MG tablet Take 10 mg by mouth once daily   atorvastatin (LIPITOR) 20 MG tablet Take 20 mg by mouth once daily   clobetasoL (TEMOVATE) 0.05 % cream   diclofenac (VOLTAREN) 1 % topical gel APPLY TOPICALLY TO AFFECTED AREA 4 TIMES DAILY   ergocalciferol, vitamin D2, 1,250 mcg (50,000 unit) capsule Take 50,000 Units by mouth once a week   fluconazole (DIFLUCAN) 150 MG tablet TAKE 1 TABLET BY MOUTH NOW, THEN TAKE 1 TABLET 7 DAYS LATER   hydrocortisone 1 % ointment APPLY OINTMENT TOPICALLY TO AFFECTED AREA TWICE DAILY AS NEEDED   ibuprofen (MOTRIN) 200 MG tablet Take by mouth   lisinopriL-hydrochlorothiazide (ZESTORETIC) 20-12.5 mg tablet Take 1 tablet by mouth once daily   meloxicam (MOBIC) 7.5 MG tablet Take 7.5 mg by mouth once daily   No current facility-administered medications on file prior to visit.   Family History  Problem Relation Age of Onset   Obesity Mother   High blood pressure (Hypertension) Mother   Coronary  Artery Disease (Blocked arteries around heart) Mother   Stroke Father   Obesity Sister   High blood pressure (Hypertension) Sister   Hyperlipidemia (Elevated cholesterol) Sister   Breast cancer Sister   High blood pressure (Hypertension) Brother    Social History   Tobacco  Use  Smoking Status Former Smoker  Smokeless Tobacco Never Used    Social History   Socioeconomic History   Marital status: Widowed  Tobacco Use   Smoking status: Former Smoker   Smokeless tobacco: Never Used  Substance and Sexual Activity   Alcohol use: Never   Drug use: Never   Objective:   Vitals:   BP: 132/86  Pulse: 96  Temp: 36.7 C (98 F)  SpO2: 98%  Weight: (!) 140 kg (308 lb 9.6 oz)  Height: 167.6 cm (5\' 6" )   Body mass index is 49.81 kg/m.  Physical Exam   GENERAL APPEARANCE Development: normal Nutritional status: normal Gross deformities: none  SKIN Rash, lesions, ulcers: none Induration, erythema: none Nodules: none palpable  EYES Conjunctiva and lids: normal Pupils: equal and reactive Iris: normal bilaterally  EARS, NOSE, MOUTH, THROAT External ears: no lesion or deformity External nose: no lesion or deformity Hearing: grossly normal Due to Covid-19 pandemic, patient is wearing a mask.  NECK Symmetric: yes Trachea: midline Thyroid: no palpable nodules in the thyroid bed, but right thyroid lobe is prominent  CHEST Respiratory effort: normal Retraction or accessory muscle use: no Breath sounds: normal bilaterally Rales, rhonchi, wheeze: none  CARDIOVASCULAR Auscultation: regular rhythm, normal rate Murmurs: none Pulses: radial pulse 2+ palpable Lower extremity edema: none  MUSCULOSKELETAL Station and gait: Staggering, difficult to flex the knees Digits and nails: no clubbing or cyanosis Muscle strength: grossly normal all extremities Range of motion: grossly normal all extremities Deformity: none  LYMPHATIC Cervical: none palpable Supraclavicular: none palpable  PSYCHIATRIC Oriented to person, place, and time: yes Mood and affect: normal for situation Judgment and insight: appropriate for situation  Assessment and Plan:   Primary hyperparathyroidism (CMS-HCC)   Patient is referred by Dr. Glade Lloyd from  endocrinology for surgical evaluation and management of suspected primary hyperparathyroidism.  Patient provided with a copy of "Parathyroid Surgery: Treatment for Your Parathyroid Gland Problem", published by [employer], 12 pages. Book reviewed and explained to patient during visit today.  Patient has biochemical evidence of primary hyperparathyroidism. I would like to further evaluate her with an ultrasound examination of the neck as well as a nuclear medicine parathyroid scan. Hopefully the studies would be able to identify the location of a parathyroid adenoma. If so she would be a good candidate for minimally invasive outpatient surgery. If the studies failed to identify an adenoma, then we will proceed with a 4D CT scan of the neck in hopes of localizing the adenoma and making her a candidate for minimally invasive surgery. The patient understands and agrees to proceed with the above studies.   Armandina Gemma, MD [facility] [place] practice Office: [phone]

## 2021-12-02 ENCOUNTER — Encounter (HOSPITAL_COMMUNITY): Payer: Self-pay | Admitting: Surgery

## 2021-12-02 ENCOUNTER — Ambulatory Visit (HOSPITAL_COMMUNITY): Payer: Medicare Other

## 2021-12-02 ENCOUNTER — Encounter (HOSPITAL_COMMUNITY)
Admission: RE | Disposition: A | Discharge status: Home / Self Care | Payer: Self-pay | Source: Ambulatory Visit | Attending: Surgery

## 2021-12-02 ENCOUNTER — Ambulatory Visit (HOSPITAL_COMMUNITY): Payer: Medicare Other | Admitting: Physician Assistant

## 2021-12-02 ENCOUNTER — Ambulatory Visit (HOSPITAL_COMMUNITY)
Admission: RE | Admit: 2021-12-02 | Discharge: 2021-12-02 | Disposition: A | Discharge status: Home / Self Care | Payer: Medicare Other | Source: Ambulatory Visit | Attending: Surgery | Admitting: Surgery

## 2021-12-02 DIAGNOSIS — Z6841 Body Mass Index (BMI) 40.0 and over, adult: Secondary | ICD-10-CM | POA: Insufficient documentation

## 2021-12-02 DIAGNOSIS — Z79899 Other long term (current) drug therapy: Secondary | ICD-10-CM | POA: Diagnosis not present

## 2021-12-02 DIAGNOSIS — E210 Primary hyperparathyroidism: Secondary | ICD-10-CM | POA: Diagnosis not present

## 2021-12-02 DIAGNOSIS — J449 Chronic obstructive pulmonary disease, unspecified: Secondary | ICD-10-CM | POA: Diagnosis not present

## 2021-12-02 DIAGNOSIS — I10 Essential (primary) hypertension: Secondary | ICD-10-CM | POA: Insufficient documentation

## 2021-12-02 DIAGNOSIS — D351 Benign neoplasm of parathyroid gland: Secondary | ICD-10-CM | POA: Diagnosis not present

## 2021-12-02 DIAGNOSIS — I4891 Unspecified atrial fibrillation: Secondary | ICD-10-CM | POA: Diagnosis not present

## 2021-12-02 HISTORY — PX: PARATHYROIDECTOMY: SHX19

## 2021-12-02 SURGERY — PARATHYROIDECTOMY
Anesthesia: General | Site: Neck | Laterality: Left

## 2021-12-02 MED ORDER — CHLORHEXIDINE GLUCONATE CLOTH 2 % EX PADS: 6.0000 | MEDICATED_PAD | Freq: Once | CUTANEOUS | Status: DC

## 2021-12-02 MED ORDER — FENTANYL CITRATE (PF) 250 MCG/5ML IJ SOLN
INTRAMUSCULAR | Status: DC | PRN
  Administered 2021-12-02: 100 ug via INTRAVENOUS

## 2021-12-02 MED ORDER — SUGAMMADEX SODIUM 500 MG/5ML IV SOLN
INTRAVENOUS | Status: AC
  Filled 2021-12-02: qty 5

## 2021-12-02 MED ORDER — AMISULPRIDE (ANTIEMETIC) 5 MG/2ML IV SOLN: 10.0000 mg | Freq: Once | INTRAVENOUS | Status: DC | PRN

## 2021-12-02 MED ORDER — TRAMADOL HCL 50 MG PO TABS: 50.0000 mg | ORAL_TABLET | Freq: Four times a day (QID) | ORAL | 0 refills | Status: DC | PRN

## 2021-12-02 MED ORDER — MIDAZOLAM HCL 2 MG/2ML IJ SOLN
INTRAMUSCULAR | Status: DC | PRN
  Administered 2021-12-02 (×2): 1 mg via INTRAVENOUS

## 2021-12-02 MED ORDER — BUPIVACAINE HCL 0.25 % IJ SOLN
INTRAMUSCULAR | Status: DC | PRN
  Administered 2021-12-02: 10 mL

## 2021-12-02 MED ORDER — PROPOFOL 10 MG/ML IV BOLUS
INTRAVENOUS | Status: DC | PRN
  Administered 2021-12-02: 160 mg via INTRAVENOUS

## 2021-12-02 MED ORDER — ORAL CARE MOUTH RINSE: 15.0000 mL | Freq: Once | OROMUCOSAL | Status: AC

## 2021-12-02 MED ORDER — SUGAMMADEX SODIUM 200 MG/2ML IV SOLN
INTRAVENOUS | Status: DC | PRN
  Administered 2021-12-02: 280 mg via INTRAVENOUS

## 2021-12-02 MED ORDER — ONDANSETRON HCL 4 MG/2ML IJ SOLN
INTRAMUSCULAR | Status: AC
  Filled 2021-12-02: qty 2

## 2021-12-02 MED ORDER — SUCCINYLCHOLINE CHLORIDE 200 MG/10ML IV SOSY
PREFILLED_SYRINGE | INTRAVENOUS | Status: DC | PRN
Start: 2021-12-02 — End: 2021-12-02
  Administered 2021-12-02: 120 mg via INTRAVENOUS

## 2021-12-02 MED ORDER — OXYCODONE HCL 5 MG PO TABS
5.0000 mg | ORAL_TABLET | Freq: Once | ORAL | Status: AC | PRN
  Administered 2021-12-02: 5 mg via ORAL

## 2021-12-02 MED ORDER — CHLORHEXIDINE GLUCONATE 0.12 % MT SOLN
15.0000 mL | Freq: Once | OROMUCOSAL | Status: AC
  Administered 2021-12-02: 06:00:00 15 mL via OROMUCOSAL

## 2021-12-02 MED ORDER — 0.9 % SODIUM CHLORIDE (POUR BTL) OPTIME
TOPICAL | Status: DC | PRN
  Administered 2021-12-02: 1000 mL

## 2021-12-02 MED ORDER — ROCURONIUM BROMIDE 10 MG/ML (PF) SYRINGE
PREFILLED_SYRINGE | INTRAVENOUS | Status: AC
  Filled 2021-12-02: qty 10

## 2021-12-02 MED ORDER — LIDOCAINE 2% (20 MG/ML) 5 ML SYRINGE
INTRAMUSCULAR | Status: DC | PRN
  Administered 2021-12-02: 40 mg via INTRAVENOUS

## 2021-12-02 MED ORDER — OXYCODONE HCL 5 MG PO TABS
ORAL_TABLET | ORAL | Status: AC
  Filled 2021-12-02: qty 1

## 2021-12-02 MED ORDER — OXYCODONE HCL 5 MG/5ML PO SOLN: 5.0000 mg | Freq: Once | ORAL | Status: AC | PRN

## 2021-12-02 MED ORDER — ROCURONIUM BROMIDE 10 MG/ML (PF) SYRINGE
PREFILLED_SYRINGE | INTRAVENOUS | Status: DC | PRN
  Administered 2021-12-02: 50 mg via INTRAVENOUS

## 2021-12-02 MED ORDER — HYDROMORPHONE HCL 1 MG/ML IJ SOLN
INTRAMUSCULAR | Status: AC
  Administered 2021-12-02: 0.5 mg via INTRAVENOUS
  Filled 2021-12-02: qty 1

## 2021-12-02 MED ORDER — CEFAZOLIN SODIUM-DEXTROSE 2-4 GM/100ML-% IV SOLN
2.0000 g | INTRAVENOUS | Status: AC
  Administered 2021-12-02: 2 g via INTRAVENOUS
  Filled 2021-12-02: qty 100

## 2021-12-02 MED ORDER — DEXAMETHASONE SODIUM PHOSPHATE 10 MG/ML IJ SOLN
INTRAMUSCULAR | Status: AC
  Filled 2021-12-02: qty 1

## 2021-12-02 MED ORDER — ACETAMINOPHEN 10 MG/ML IV SOLN: 1000.0000 mg | Freq: Once | INTRAVENOUS | Status: DC | PRN

## 2021-12-02 MED ORDER — MIDAZOLAM HCL 2 MG/2ML IJ SOLN
INTRAMUSCULAR | Status: AC
  Filled 2021-12-02: qty 2

## 2021-12-02 MED ORDER — EPHEDRINE SULFATE-NACL 50-0.9 MG/10ML-% IV SOSY
PREFILLED_SYRINGE | INTRAVENOUS | Status: DC | PRN
  Administered 2021-12-02: 10 mg via INTRAVENOUS
  Administered 2021-12-02: 5 mg via INTRAVENOUS

## 2021-12-02 MED ORDER — BUPIVACAINE HCL 0.25 % IJ SOLN
INTRAMUSCULAR | Status: AC
  Filled 2021-12-02: qty 1

## 2021-12-02 MED ORDER — HEMOSTATIC AGENTS (NO CHARGE) OPTIME
TOPICAL | Status: DC | PRN
  Administered 2021-12-02: 1 via TOPICAL

## 2021-12-02 MED ORDER — LACTATED RINGERS IV SOLN: INTRAVENOUS | Status: DC

## 2021-12-02 MED ORDER — HYDROMORPHONE HCL 1 MG/ML IJ SOLN
0.2500 mg | INTRAMUSCULAR | Status: DC | PRN
  Administered 2021-12-02: 0.5 mg via INTRAVENOUS

## 2021-12-02 MED ORDER — ONDANSETRON HCL 4 MG/2ML IJ SOLN: 4.0000 mg | Freq: Once | INTRAMUSCULAR | Status: DC | PRN

## 2021-12-02 MED ORDER — ONDANSETRON HCL 4 MG/2ML IJ SOLN
INTRAMUSCULAR | Status: DC | PRN
  Administered 2021-12-02: 4 mg via INTRAVENOUS

## 2021-12-02 MED ORDER — PROPOFOL 10 MG/ML IV BOLUS
INTRAVENOUS | Status: AC
  Filled 2021-12-02: qty 20

## 2021-12-02 MED ORDER — FENTANYL CITRATE (PF) 100 MCG/2ML IJ SOLN
INTRAMUSCULAR | Status: AC
  Filled 2021-12-02: qty 2

## 2021-12-02 MED ORDER — PHENYLEPHRINE 40 MCG/ML (10ML) SYRINGE FOR IV PUSH (FOR BLOOD PRESSURE SUPPORT)
PREFILLED_SYRINGE | INTRAVENOUS | Status: DC | PRN
  Administered 2021-12-02 (×2): 80 ug via INTRAVENOUS
  Administered 2021-12-02 (×2): 40 ug via INTRAVENOUS

## 2021-12-02 MED ORDER — DEXAMETHASONE SODIUM PHOSPHATE 10 MG/ML IJ SOLN
INTRAMUSCULAR | Status: DC | PRN
  Administered 2021-12-02: 4 mg via INTRAVENOUS

## 2021-12-02 SURGICAL SUPPLY — 36 items
ADH SKN CLS APL DERMABOND .7 (GAUZE/BANDAGES/DRESSINGS) ×1
APL PRP STRL LF DISP 70% ISPRP (MISCELLANEOUS) ×1
ATTRACTOMAT 16X20 MAGNETIC DRP (DRAPES) ×3 IMPLANT
BAG COUNTER SPONGE SURGICOUNT (BAG) ×2 IMPLANT
BAG SPNG CNTER NS LX DISP (BAG)
BLADE SURG 15 STRL LF DISP TIS (BLADE) ×2 IMPLANT
BLADE SURG 15 STRL SS (BLADE) ×2
CHLORAPREP W/TINT 26 (MISCELLANEOUS) ×3 IMPLANT
CLIP TI MEDIUM 6 (CLIP) ×6 IMPLANT
CLIP TI WIDE RED SMALL 6 (CLIP) ×7 IMPLANT
COVER SURGICAL LIGHT HANDLE (MISCELLANEOUS) ×3 IMPLANT
DERMABOND ADVANCED (GAUZE/BANDAGES/DRESSINGS) ×1
DERMABOND ADVANCED .7 DNX12 (GAUZE/BANDAGES/DRESSINGS) ×2 IMPLANT
DRAPE LAPAROTOMY T 98X78 PEDS (DRAPES) ×3 IMPLANT
DRAPE UTILITY XL STRL (DRAPES) ×3 IMPLANT
ELECT REM PT RETURN 15FT ADLT (MISCELLANEOUS) ×3 IMPLANT
GAUZE 4X4 16PLY ~~LOC~~+RFID DBL (SPONGE) ×3 IMPLANT
GLOVE SURG SYN 7.5  E (GLOVE) ×4
GLOVE SURG SYN 7.5 E (GLOVE) ×2 IMPLANT
GLOVE SURG SYN 7.5 PF PI (GLOVE) ×4 IMPLANT
GOWN STRL REUS W/TWL XL LVL3 (GOWN DISPOSABLE) ×9 IMPLANT
HEMOSTAT SURGICEL 2X4 FIBR (HEMOSTASIS) ×3 IMPLANT
ILLUMINATOR WAVEGUIDE N/F (MISCELLANEOUS) IMPLANT
KIT BASIN OR (CUSTOM PROCEDURE TRAY) ×3 IMPLANT
KIT TURNOVER KIT A (KITS) ×1 IMPLANT
NDL HYPO 25X1 1.5 SAFETY (NEEDLE) ×2 IMPLANT
NEEDLE HYPO 25X1 1.5 SAFETY (NEEDLE) ×2 IMPLANT
PACK BASIC VI WITH GOWN DISP (CUSTOM PROCEDURE TRAY) ×3 IMPLANT
PENCIL SMOKE EVACUATOR (MISCELLANEOUS) ×3 IMPLANT
SUT MNCRL AB 4-0 PS2 18 (SUTURE) ×3 IMPLANT
SUT VIC AB 3-0 SH 18 (SUTURE) ×4 IMPLANT
SYR BULB IRRIG 60ML STRL (SYRINGE) ×3 IMPLANT
SYR CONTROL 10ML LL (SYRINGE) ×3 IMPLANT
TOWEL OR 17X26 10 PK STRL BLUE (TOWEL DISPOSABLE) ×3 IMPLANT
TOWEL OR NON WOVEN STRL DISP B (DISPOSABLE) ×3 IMPLANT
TUBING CONNECTING 10 (TUBING) ×3 IMPLANT

## 2021-12-02 NOTE — Interval H&P Note (Signed)
History and Physical Interval Note:  12/02/2021 7:01 AM  Melissa Garner  has presented today for surgery, with the diagnosis of PRIMARY HYPERPARATHYROIDISM.  The various methods of treatment have been discussed with the patient and family. After consideration of risks, benefits and other options for treatment, the patient has consented to    Procedure(s): LEFT PARATHYROIDECTOMY (Left) as a surgical intervention.    The patient's history has been reviewed, patient examined, no change in status, stable for surgery.  I have reviewed the patient's chart and labs.  Questions were answered to the patient's satisfaction.    Armandina Gemma, [facility] [place] practice Office: [place]

## 2021-12-02 NOTE — Anesthesia Postprocedure Evaluation (Signed)
Anesthesia Post Note  Patient: Melissa Garner  Procedure(s) Performed: LEFT PARATHYROIDECTOMY (Left: Neck)     Patient location during evaluation: PACU Anesthesia Type: General Level of consciousness: awake and alert Pain management: pain level controlled Vital Signs Assessment: post-procedure vital signs reviewed and stable Respiratory status: spontaneous breathing, nonlabored ventilation, respiratory function stable and patient connected to nasal cannula oxygen Cardiovascular status: blood pressure returned to baseline and stable Postop Assessment: no apparent nausea or vomiting Anesthetic complications: no   No notable events documented.  Last Vitals:  Vitals:   12/02/21 0945 12/02/21 0955  BP: (!) 150/77 (!) 154/99  Pulse: 60 64  Resp: 16   Temp: 36.5 C 36.6 C  SpO2: 95% 93%    Last Pain:  Vitals:   12/02/21 0955  TempSrc:   PainSc: 0-No pain                 Barnet Glasgow

## 2021-12-02 NOTE — Op Note (Signed)
OPERATIVE REPORT - PARATHYROIDECTOMY  Preoperative diagnosis: Primary hyperparathyroidism  Postop diagnosis: Same  Procedure: Left superior minimally invasive parathyroidectomy  Surgeon:  Armandina Gemma, MD  Anesthesia: General endotracheal  Estimated blood loss: Minimal  Preparation: ChloraPrep  Indications: Patient is a 66 year old female referred by Dr. Glade Lloyd for surgical evaluation and management of primary hyperparathyroidism. Patient had been noted proximately 2 years ago to have elevated serum calcium levels. She complained of neck discomfort. She is also noted fatigue as well as bone and joint pain. She has frequent urination. Laboratory studies revealed an elevated serum calcium level. Her recent high level was 11.0 and the highest intact PTH level measured was 175. Patient was treated for vitamin D deficiency. Her most recent 25 hydroxy vitamin D level was normal at 42.7. 24-hour urine collection for calcium last spring was normal at 172. Patient has had no prior head or neck surgery. She has had no history of thyroid disease. There is no family history of parathyroid disease. There is a family history of thyroid cancer in the patient's son. He required surgery but is alive and doing well. Patient is retired. 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  Procedure: The patient was prepared in the pre-operative holding area. The patient was brought to the operating room and placed in a supine position on the operating room table. Following administration of general anesthesia, the patient was positioned and then prepped and draped in the usual strict aseptic fashion. After ascertaining that an adequate level of anesthesia been achieved, a neck incision was made with a #15 blade. Dissection was carried through subcutaneous tissues and platysma. Hemostasis was obtained with the electrocautery. Skin flaps were developed circumferentially and a Weitlander  retractor was placed for exposure.  Strap muscles were incised in the midline. Strap muscles were reflected laterally exposing the thyroid lobe. With gentle blunt dissection the thyroid lobe was mobilized.  Dissection was carried posteriorly and an enlarged parathyroid gland was identified posterior to the superior pole of the thyroid. It was gently mobilized. Vascular structures were divided between small ligaclips. Care was taken to avoid the recurrent laryngeal nerve. The parathyroid gland was completely excised. It was submitted to pathology where frozen section confirmed hypercellular parathyroid tissue consistent with adenoma.  Neck was irrigated with warm saline and good hemostasis was noted. Fibrillar was placed in the operative field. Strap muscles were approximated in the midline with interrupted 3-0 Vicryl sutures. Platysma was closed with interrupted 3-0 Vicryl sutures. Marcaine was infiltrated circumferentially. Skin was closed with a running 4-0 Monocryl subcuticular suture. Wound was washed and dried and Dermabond was applied. Patient was awakened from anesthesia and brought to the recovery room. The patient tolerated the procedure well.   Armandina Gemma, MD [facility] Office: [phone]

## 2021-12-02 NOTE — Transfer of Care (Signed)
Immediate Anesthesia Transfer of Care Note  Patient: Melissa Garner  Procedure(s) Performed: LEFT PARATHYROIDECTOMY (Left: Neck)  Patient Location: PACU  Anesthesia Type:General  Level of Consciousness: awake, alert  and patient cooperative  Airway & Oxygen Therapy: Patient Spontanous Breathing and Patient connected to face mask oxygen  Post-op Assessment: Report given to RN and Post -op Vital signs reviewed and stable  Post vital signs: Reviewed and stable  Last Vitals:  Vitals Value Taken Time  BP 162/99 12/02/21 0857  Temp    Pulse 80 12/02/21 0858  Resp 15 12/02/21 0858  SpO2 100 % 12/02/21 0858  Vitals shown include unvalidated device data.  Last Pain:  Vitals:   12/02/21 0549  TempSrc: Oral  PainSc: 0-No pain         Complications: No notable events documented.

## 2021-12-02 NOTE — Discharge Instructions (Signed)
[facility] - Dr. Shanayah Kaffenberger  THYROID & PARATHYROID SURGERY:  POST-OP INSTRUCTIONS  Always review the instruction sheet provided by the hospital nurse at discharge.  A prescription for pain medication may be sent to your pharmacy at the time of discharge.  Take your pain medication as prescribed.  If narcotic pain medicine is not needed, then you may take acetaminophen (Tylenol) or ibuprofen (Advil) as needed for pain or soreness.  Take your normal home medications as prescribed unless otherwise directed.  If you need a refill on your pain medication, please contact the office during regular business hours.  Prescriptions will not be processed by the office after 5:00PM or on weekends.  Start with a light diet upon arrival home, such as soup and crackers or toast.  Be sure to drink plenty of fluids.  Resume your normal diet the day after surgery.  Most patients will experience some swelling and bruising on the chest and neck area.  Ice packs will help for the first 48 hours after arriving home.  Swelling and bruising will take several days to resolve.   It is common to experience some constipation after surgery.  Increasing fluid intake and taking a stool softener (Colace) will usually help to prevent this problem.  A mild laxative (Milk of Magnesia or Miralax) should be taken according to package directions if there has been no bowel movement after 48 hours.  Dermabond glue covers your incision. This seals the wound and you may shower at any time. The Dermabond will remain in place for about a week.  You may gradually remove the glue when it loosens around the edges.  If you need to loosen the Dermabond for removal, apply a layer of Vaseline to the wound for 15 minutes and then remove with a Kleenex. Your sutures are under the skin and will not show - they will dissolve on their own.  You may resume light daily activities beginning the day after discharge (such as self-care,  walking, climbing stairs), gradually increasing activities as tolerated. You may have sexual intercourse when it is comfortable. Refrain from any heavy lifting or straining until approved by your doctor. You may drive when you no longer are taking prescription pain medication, you can comfortably wear a seatbelt, and you can safely maneuver your car and apply the brakes.  You will see your doctor in the office for a follow-up appointment approximately three weeks after your surgery.  Make sure that you call for this appointment within a day or two after you arrive home to insure a convenient appointment time. Please have any requested laboratory tests performed a few days prior to your office visit so that the results will be available at your follow up appointment.  WHEN TO CALL THE [facility] OFFICE: -- Fever greater than 101.5 -- Inability to urinate -- Nausea and/or vomiting - persistent -- Extreme swelling or bruising -- Continued bleeding from incision -- Increased pain, redness, or drainage from the incision -- Difficulty swallowing or breathing -- Muscle cramping or spasms -- Numbness or tingling in hands or around lips  The clinic staff is available to answer your questions during regular business hours.  Please don't hesitate to call and ask to speak to one of the nurses if you have concerns.  [facility] OFFICE: [phone] (24 hours)  Please sign up for MyChart accounts. This will allow you to communicate directly with my nurse or myself without having to call the office. It will also allow you   to view your test results. You will need to enroll in MyChart for my office ([facility]) and for the hospital ([place]).  Denia Mcvicar, MD [facility] practice 

## 2021-12-02 NOTE — Anesthesia Procedure Notes (Signed)
Procedure Name: Intubation Date/Time: 12/02/2021 7:29 AM Performed by: Eben Burow, CRNA Pre-anesthesia Checklist: Patient identified, Emergency Drugs available, Suction available, Patient being monitored and Timeout performed Patient Re-evaluated:Patient Re-evaluated prior to induction Oxygen Delivery Method: Circle system utilized Preoxygenation: Pre-oxygenation with 100% oxygen Induction Type: IV induction Ventilation: Mask ventilation without difficulty Laryngoscope Size: Mac and 4 Grade View: Grade I Tube type: Oral Tube size: 7.0 mm Number of attempts: 1 Airway Equipment and Method: Stylet Placement Confirmation: ETT inserted through vocal cords under direct vision, positive ETCO2 and breath sounds checked- equal and bilateral Secured at: 22 cm Tube secured with: Tape Dental Injury: Teeth and Oropharynx as per pre-operative assessment

## 2021-12-03 ENCOUNTER — Encounter (HOSPITAL_COMMUNITY): Payer: Self-pay | Admitting: Surgery

## 2021-12-03 LAB — SURGICAL PATHOLOGY

## 2021-12-20 ENCOUNTER — Other Ambulatory Visit: Payer: Self-pay

## 2021-12-20 DIAGNOSIS — E210 Primary hyperparathyroidism: Secondary | ICD-10-CM

## 2021-12-20 DIAGNOSIS — E042 Nontoxic multinodular goiter: Secondary | ICD-10-CM

## 2021-12-21 DIAGNOSIS — E210 Primary hyperparathyroidism: Secondary | ICD-10-CM | POA: Diagnosis not present

## 2021-12-21 DIAGNOSIS — E892 Postprocedural hypoparathyroidism: Secondary | ICD-10-CM | POA: Diagnosis not present

## 2021-12-22 ENCOUNTER — Other Ambulatory Visit: Payer: Self-pay | Admitting: Orthopedic Surgery

## 2021-12-22 DIAGNOSIS — M25812 Other specified joint disorders, left shoulder: Secondary | ICD-10-CM

## 2021-12-22 DIAGNOSIS — M7542 Impingement syndrome of left shoulder: Secondary | ICD-10-CM

## 2021-12-22 DIAGNOSIS — M25512 Pain in left shoulder: Secondary | ICD-10-CM

## 2021-12-28 ENCOUNTER — Ambulatory Visit: Payer: Medicare Other | Admitting: "Endocrinology

## 2021-12-28 DIAGNOSIS — I10 Essential (primary) hypertension: Secondary | ICD-10-CM | POA: Diagnosis not present

## 2021-12-28 DIAGNOSIS — M1711 Unilateral primary osteoarthritis, right knee: Secondary | ICD-10-CM | POA: Diagnosis not present

## 2021-12-28 DIAGNOSIS — E785 Hyperlipidemia, unspecified: Secondary | ICD-10-CM | POA: Diagnosis not present

## 2022-01-31 DIAGNOSIS — M150 Primary generalized (osteo)arthritis: Secondary | ICD-10-CM | POA: Diagnosis not present

## 2022-01-31 DIAGNOSIS — I10 Essential (primary) hypertension: Secondary | ICD-10-CM | POA: Diagnosis not present

## 2022-01-31 DIAGNOSIS — I4811 Longstanding persistent atrial fibrillation: Secondary | ICD-10-CM | POA: Diagnosis not present

## 2022-01-31 DIAGNOSIS — E785 Hyperlipidemia, unspecified: Secondary | ICD-10-CM | POA: Diagnosis not present

## 2022-02-16 DIAGNOSIS — E210 Primary hyperparathyroidism: Secondary | ICD-10-CM | POA: Diagnosis not present

## 2022-02-16 DIAGNOSIS — E042 Nontoxic multinodular goiter: Secondary | ICD-10-CM | POA: Diagnosis not present

## 2022-02-17 LAB — TSH: TSH: 2.76 u[IU]/mL (ref 0.450–4.500)

## 2022-02-17 LAB — PTH, INTACT AND CALCIUM
Calcium: 9.3 mg/dL (ref 8.7–10.3)
PTH: 53 pg/mL (ref 15–65)

## 2022-02-17 LAB — T4, FREE: Free T4: 1.02 ng/dL (ref 0.82–1.77)

## 2022-02-22 ENCOUNTER — Ambulatory Visit (INDEPENDENT_AMBULATORY_CARE_PROVIDER_SITE_OTHER): Payer: Medicare Other | Admitting: "Endocrinology

## 2022-02-22 ENCOUNTER — Encounter: Payer: Self-pay | Admitting: "Endocrinology

## 2022-02-22 VITALS — BP 134/80 | HR 64 | Ht 66.0 in | Wt 314.8 lb

## 2022-02-22 DIAGNOSIS — E042 Nontoxic multinodular goiter: Secondary | ICD-10-CM | POA: Diagnosis not present

## 2022-02-22 DIAGNOSIS — E210 Primary hyperparathyroidism: Secondary | ICD-10-CM | POA: Diagnosis not present

## 2022-02-22 DIAGNOSIS — E559 Vitamin D deficiency, unspecified: Secondary | ICD-10-CM

## 2022-02-22 NOTE — Progress Notes (Signed)
? ?02/22/2022 ?   ?Endocrinology follow-up note ?     ?Melissa Garner is a 66 y.o.-year-old female, referred by her  PCP  Iona Beard, MD , for evaluation for hypercalcemia/hyperparathyroidism.  ?She is returning for follow-up with repeat labs. ? ?Past Medical History:  ?Diagnosis Date  ? Aortic stenosis   ? COPD (chronic obstructive pulmonary disease) ([place])   ? Essential hypertension   ? Hyperlipidemia   ? Lumbar disc disease   ? Left leg pain  ? Obesity   ? Osteoarthritis   ? Parathyroid abnormality ([place])   ? Paroxysmal atrial fibrillation ([facility])   ? Upper respiratory infection   ? ? ?Past Surgical History:  ?Procedure Laterality Date  ? ABDOMINAL HYSTERECTOMY  1989  ? Partial  ? CARPAL TUNNEL RELEASE Bilateral 2005  ? COLONOSCOPY N/A 01/14/2016  ? Procedure: COLONOSCOPY;  Surgeon: Danie Binder, MD;  [facility];  Service: Endoscopy;  Laterality: N/A;  230 ?  ? KNEE SURGERY Left 2008  ? total  ? PARATHYROIDECTOMY Left 12/02/2021  ? Procedure: LEFT PARATHYROIDECTOMY;  Surgeon: Armandina Gemma, MD;  Location: [facility];  Service: General;  Laterality: Left;  ? ? ?Social History  ? ?Tobacco Use  ? Smoking status: Never  ? Smokeless tobacco: Never  ?Vaping Use  ? Vaping Use: Never used  ?Substance Use Topics  ? Alcohol use: No  ?  Alcohol/week: 0.0 standard drinks  ? Drug use: No  ? ? ?Family History  ?Problem Relation Age of Onset  ? Heart failure Mother   ? Alcoholism Father   ? Cancer Father   ? COPD Sister   ? Hypertension Sister   ? Arthritis Sister   ? Seizures Sister   ? Hypertension Brother   ? Arthritis Brother   ? Hypertension Brother   ? Arthritis Brother   ? Hypertension Brother   ? Arthritis Brother   ? Hypertension Sister   ? Hypertension Sister   ? COPD Sister   ? Hypertension Daughter   ? Hypertension Son   ? Crohn's disease Son   ? Colon cancer Neg Hx   ? ? ?Outpatient Encounter Medications as of 02/22/2022  ?Medication Sig  ? acetaminophen (TYLENOL) 500 MG tablet Take 1,000 mg by mouth every  8 (eight) hours as needed for moderate pain.  ? albuterol (VENTOLIN HFA) 108 (90 Base) MCG/ACT inhaler Inhale 2 puffs into the lungs daily.  ? amLODipine (NORVASC) 10 MG tablet Take 1 tablet (10 mg total) by mouth daily.  ? apixaban (ELIQUIS) 5 MG TABS tablet Take 5 mg by mouth 2 (two) times daily.  ? atorvastatin (LIPITOR) 20 MG tablet Take 20 mg by mouth daily.  ? Cholecalciferol 50 MCG (2000 UT) CAPS Take 1 capsule (2,000 Units total) by mouth daily with breakfast.  ? clobetasol cream (TEMOVATE) 9.62 % Apply 1 application topically daily as needed (breakouts).  ? diclofenac sodium (VOLTAREN) 1 % GEL Apply 1 application topically daily as needed (pain).  ? lisinopril-hydrochlorothiazide (PRINZIDE,ZESTORETIC) 20-12.5 MG tablet Take 1 tablet by mouth daily.  ? meloxicam (MOBIC) 7.5 MG tablet Take 1 tablet by mouth once daily  ? ondansetron (ZOFRAN-ODT) 4 MG disintegrating tablet Take 1 tablet (4 mg total) by mouth every 8 (eight) hours as needed for nausea or vomiting.  ? traMADol (ULTRAM) 50 MG tablet Take 1-2 tablets (50-100 mg total) by mouth every 6 (six) hours as needed for moderate pain.  ? ?No facility-administered encounter medications on file as of 02/22/2022.  ? ? ?  No Known Allergies ? ? ?HPI  ?Melissa Garner is returning for follow-up after she was seen in consultation for hypercalcemia associated with elevated PTH level.  Her presentation was consistent with early primary hyperparathyroidism. After appropriate work confirming hypercalcemia as a result of PHPT, she was referred for surgery. ?She underwent surgery by Dr. Armandina Gemma on 12/03/21. Surgical out come: Left superior adenoma. ?Her post surgery labs show correction of calcium and PTH. ?  She is status post treatment for vitamin D deficiency currently replete at 42.7. ?Patient has no previously known  pituitary, adrenal dysfunctions; no family history of such dysfunctions. ?-She has normal renal function.  She did not have any recent bone  density. ? ?No prior history of fragility fractures or falls. No history of  kidney stones. ? ?No history of CKD.  ? ?she is not on HCTZ or other thiazide therapy. she is not on calcium supplements,  she eats dairy and green, leafy, vegetables on average amounts. ? ?she does not have a family history of hypercalcemia, pituitary tumors, thyroid cancer, or osteoporosis.  Her bone density in October 2021 was normal. ? ?During recent trip to ER she underwent CT scan of head and neck which revealed multiple thyroid nodules. ? She underwent thyroid ultrasound which confirms multiple nodules including 2.5 cm suspicious nodule in the left lobe.  Biopsy of the suspicious nodule before this visit was benign. ?-She does not have any new complaints since last visit. ? ? ?ROS: ?Limited as above. ? ?PE: ?BP 134/80   Pulse 64   Ht '5\' 6"'$  (1.676 m)   Wt (!) 314 lb 12.8 oz (142.8 kg)   BMI 50.81 kg/m? , Body mass index is 50.81 kg/m?. ?Wt Readings from Last 3 Encounters:  ?02/22/22 (!) 314 lb 12.8 oz (142.8 kg)  ?12/02/21 (!) 307 lb (139.3 kg)  ?11/26/21 (!) 307 lb (139.3 kg)  ?  ? ?CMP ( most recent) ?CMP  ?   ?Component Value Date/Time  ? NA 139 11/26/2021 1306  ? K 3.8 11/26/2021 1306  ? CL 105 11/26/2021 1306  ? CO2 28 11/26/2021 1306  ? GLUCOSE 117 (H) 11/26/2021 1306  ? BUN 15 11/26/2021 1306  ? CREATININE 0.74 11/26/2021 1306  ? CALCIUM 9.3 02/16/2022 1318  ? PROT 7.7 01/12/2013 1353  ? ALBUMIN 3.9 01/12/2013 1353  ? AST 15 01/12/2013 1353  ? ALT 10 01/12/2013 1353  ? ALKPHOS 103 01/12/2013 1353  ? BILITOT 0.4 01/12/2013 1353  ? GFRNONAA >60 11/26/2021 1306  ? GFRAA >60 07/04/2019 0905  ? ? ? ?Lab Results  ?Component Value Date  ? TSH 2.760 02/16/2022  ? TSH 1.250 06/16/2021  ? TSH 1.730 02/15/2021  ? TSH 1.45 08/05/2020  ? TSH 1.38 05/20/2019  ? TSH 1.716 07/11/2013  ? FREET4 1.02 02/16/2022  ? FREET4 1.04 06/16/2021  ? FREET4 1.00 02/15/2021  ? FREET4 1.0 08/05/2020  ? FREET4 0.94 07/11/2013  ?  ?May 20, 2019 ?PTH 119,  calcium 10.7 ? ?Assessment: ?1. Hypercalcemia / Hyperparathyroidism- resolved after surgery ?2.  Multinodular goiter-benign FNA. ?3.  Vitamin D deficiency ? ?Plan: ?I discussed her surgical findings, post surgical labs. Her PTH and calcium are corrected . Left superior parathyroid adenoma. She will not need further treatment at this time. ?   Her bone density is normal.  ? ?- No apparent complications from hypercalcemia/hyperparathyroidism: no history of  nephrolithiasis,  osteoporosis, fragility fractures. No abdominal pain, no major mood disorders, no bone pain. ?  ?She will  be continued on vitamin D3 2000 units daily. ?  ?  She is advised to avoid any over-the-counter calcium supplements.  ? ?Regarding her multinodular goiter, she is status post biopsy of left lobe nodule with benign outcomes in October 2020.  She will not need any definitive antithyroid intervention at this time. ? ?She is advised to maintain close follow-up with her PCP. ? ? ? ?I spent 31 minutes in the care of the patient today including review of labs from Thyroid Function, CMP, and other relevant labs ; imaging/biopsy records (current and previous including abstractions from other facilities); face-to-face time discussing  her lab results and symptoms, medications doses, her options of short and long term treatment based on the latest standards of care / guidelines;   and documenting the encounter. ? ?Melissa Garner  participated in the discussions, expressed understanding, and voiced agreement with the above plans.  All questions were answered to her satisfaction. she is encouraged to contact clinic should she have any questions or concerns prior to her return visit. ? ? ? ?- Return in about 6 months (around 08/24/2022) for F/U with Pre-visit Labs. ? ? ?Glade Lloyd, MD ?[place] ?[facility] ?[address] ?[place], [place] [address] ?Phone: [phone]  Fax: [phone]  ? ? ?This note was  partially dictated with voice recognition software. Similar sounding words can be transcribed inadequately or may not  be corrected upon review. ? ?02/22/2022, 4:26 PM ?

## 2022-02-27 DIAGNOSIS — E785 Hyperlipidemia, unspecified: Secondary | ICD-10-CM | POA: Diagnosis not present

## 2022-02-27 DIAGNOSIS — I10 Essential (primary) hypertension: Secondary | ICD-10-CM | POA: Diagnosis not present

## 2022-02-28 ENCOUNTER — Other Ambulatory Visit: Payer: Self-pay | Admitting: Orthopedic Surgery

## 2022-02-28 DIAGNOSIS — M25512 Pain in left shoulder: Secondary | ICD-10-CM

## 2022-02-28 DIAGNOSIS — M25812 Other specified joint disorders, left shoulder: Secondary | ICD-10-CM

## 2022-04-29 DIAGNOSIS — I10 Essential (primary) hypertension: Secondary | ICD-10-CM | POA: Diagnosis not present

## 2022-04-29 DIAGNOSIS — E785 Hyperlipidemia, unspecified: Secondary | ICD-10-CM | POA: Diagnosis not present

## 2022-05-02 DIAGNOSIS — M150 Primary generalized (osteo)arthritis: Secondary | ICD-10-CM | POA: Diagnosis not present

## 2022-05-02 DIAGNOSIS — I4819 Other persistent atrial fibrillation: Secondary | ICD-10-CM | POA: Diagnosis not present

## 2022-05-02 DIAGNOSIS — E559 Vitamin D deficiency, unspecified: Secondary | ICD-10-CM | POA: Diagnosis not present

## 2022-05-02 DIAGNOSIS — I480 Paroxysmal atrial fibrillation: Secondary | ICD-10-CM | POA: Diagnosis not present

## 2022-05-02 DIAGNOSIS — E785 Hyperlipidemia, unspecified: Secondary | ICD-10-CM | POA: Diagnosis not present

## 2022-05-02 DIAGNOSIS — I10 Essential (primary) hypertension: Secondary | ICD-10-CM | POA: Diagnosis not present

## 2022-05-30 DIAGNOSIS — I10 Essential (primary) hypertension: Secondary | ICD-10-CM | POA: Diagnosis not present

## 2022-05-30 DIAGNOSIS — M1711 Unilateral primary osteoarthritis, right knee: Secondary | ICD-10-CM | POA: Diagnosis not present

## 2022-05-30 DIAGNOSIS — E785 Hyperlipidemia, unspecified: Secondary | ICD-10-CM | POA: Diagnosis not present

## 2022-08-02 DIAGNOSIS — Z1231 Encounter for screening mammogram for malignant neoplasm of breast: Secondary | ICD-10-CM | POA: Diagnosis not present

## 2022-08-02 DIAGNOSIS — I480 Paroxysmal atrial fibrillation: Secondary | ICD-10-CM | POA: Diagnosis not present

## 2022-08-02 DIAGNOSIS — I10 Essential (primary) hypertension: Secondary | ICD-10-CM | POA: Diagnosis not present

## 2022-08-02 DIAGNOSIS — E559 Vitamin D deficiency, unspecified: Secondary | ICD-10-CM | POA: Diagnosis not present

## 2022-08-02 DIAGNOSIS — M150 Primary generalized (osteo)arthritis: Secondary | ICD-10-CM | POA: Diagnosis not present

## 2022-08-02 DIAGNOSIS — E785 Hyperlipidemia, unspecified: Secondary | ICD-10-CM | POA: Diagnosis not present

## 2022-08-23 DIAGNOSIS — E210 Primary hyperparathyroidism: Secondary | ICD-10-CM | POA: Diagnosis not present

## 2022-08-23 DIAGNOSIS — E042 Nontoxic multinodular goiter: Secondary | ICD-10-CM | POA: Diagnosis not present

## 2022-08-24 ENCOUNTER — Ambulatory Visit: Payer: Medicare Other | Admitting: "Endocrinology

## 2022-08-24 LAB — T4, FREE: Free T4: 1.13 ng/dL (ref 0.82–1.77)

## 2022-08-24 LAB — PTH, INTACT AND CALCIUM
Calcium: 9.2 mg/dL (ref 8.7–10.3)
PTH: 66 pg/mL — ABNORMAL HIGH (ref 15–65)

## 2022-08-24 LAB — TSH: TSH: 1.62 u[IU]/mL (ref 0.450–4.500)

## 2022-09-09 ENCOUNTER — Encounter: Payer: Self-pay | Admitting: "Endocrinology

## 2022-09-09 ENCOUNTER — Ambulatory Visit (INDEPENDENT_AMBULATORY_CARE_PROVIDER_SITE_OTHER): Payer: Medicare Other | Admitting: "Endocrinology

## 2022-09-09 VITALS — BP 138/76 | HR 76 | Ht 66.0 in | Wt 309.8 lb

## 2022-09-09 DIAGNOSIS — E210 Primary hyperparathyroidism: Secondary | ICD-10-CM | POA: Diagnosis not present

## 2022-09-09 DIAGNOSIS — E559 Vitamin D deficiency, unspecified: Secondary | ICD-10-CM

## 2022-09-09 DIAGNOSIS — E042 Nontoxic multinodular goiter: Secondary | ICD-10-CM | POA: Diagnosis not present

## 2022-09-09 NOTE — Progress Notes (Unsigned)
09/09/2022    Endocrinology follow-up note      Melissa Garner is a 66 y.o.-year-old female, referred by her  PCP  Iona Beard, MD , for evaluation for hypercalcemia/hyperparathyroidism.  She is returning for follow-up with repeat labs.  Past Medical History:  Diagnosis Date   Aortic stenosis    COPD (chronic obstructive pulmonary disease) ([facility])    Essential hypertension    Hyperlipidemia    Lumbar disc disease    Left leg pain   Obesity    Osteoarthritis    Parathyroid abnormality ([facility])    Paroxysmal atrial fibrillation ([place])    Upper respiratory infection     Past Surgical History:  Procedure Laterality Date   ABDOMINAL HYSTERECTOMY  1989   Partial   CARPAL TUNNEL RELEASE Bilateral 2005   COLONOSCOPY N/A 01/14/2016   Procedure: COLONOSCOPY;  Surgeon: Danie Binder, MD;  [facility];  Service: Endoscopy;  Laterality: N/A;  230    KNEE SURGERY Left 2008   total   PARATHYROIDECTOMY Left 12/02/2021   Procedure: LEFT PARATHYROIDECTOMY;  Surgeon: Armandina Gemma, MD;  Location: [facility];  Service: General;  Laterality: Left;    Social History   Tobacco Use   Smoking status: Never   Smokeless tobacco: Never  Vaping Use   Vaping Use: Never used  Substance Use Topics   Alcohol use: No    Alcohol/week: 0.0 standard drinks of alcohol   Drug use: No    Family History  Problem Relation Age of Onset   Heart failure Mother    Alcoholism Father    Cancer Father    COPD Sister    Hypertension Sister    Arthritis Sister    Seizures Sister    Hypertension Brother    Arthritis Brother    Hypertension Brother    Arthritis Brother    Hypertension Brother    Arthritis Brother    Hypertension Sister    Hypertension Sister    COPD Sister    Hypertension Daughter    Hypertension Son    Crohn's disease Son    Colon cancer Neg Hx     Outpatient Encounter Medications as of 09/09/2022  Medication Sig   acetaminophen (TYLENOL) 500 MG tablet Take 1,000 mg by  mouth every 8 (eight) hours as needed for moderate pain.   albuterol (VENTOLIN HFA) 108 (90 Base) MCG/ACT inhaler Inhale 2 puffs into the lungs daily.   amLODipine (NORVASC) 10 MG tablet Take 1 tablet (10 mg total) by mouth daily.   apixaban (ELIQUIS) 5 MG TABS tablet Take 5 mg by mouth 2 (two) times daily.   atorvastatin (LIPITOR) 20 MG tablet Take 20 mg by mouth daily.   Cholecalciferol 50 MCG (2000 UT) CAPS Take 1 capsule (2,000 Units total) by mouth daily with breakfast.   clobetasol cream (TEMOVATE) 0.10 % Apply 1 application topically daily as needed (breakouts).   diclofenac sodium (VOLTAREN) 1 % GEL Apply 1 application topically daily as needed (pain).   lisinopril-hydrochlorothiazide (PRINZIDE,ZESTORETIC) 20-12.5 MG tablet Take 1 tablet by mouth daily.   meloxicam (MOBIC) 7.5 MG tablet Take 1 tablet by mouth once daily (Patient not taking: Reported on 09/09/2022)   ondansetron (ZOFRAN-ODT) 4 MG disintegrating tablet Take 1 tablet (4 mg total) by mouth every 8 (eight) hours as needed for nausea or vomiting. (Patient not taking: Reported on 09/09/2022)   traMADol (ULTRAM) 50 MG tablet Take 1-2 tablets (50-100 mg total) by mouth every 6 (six) hours as needed for  moderate pain. (Patient not taking: Reported on 09/09/2022)   No facility-administered encounter medications on file as of 09/09/2022.    No Known Allergies   HPI  Melissa Garner is returning for follow-up after she was seen in consultation for hypercalcemia associated with elevated PTH level.  Her presentation was consistent with primary hyperparathyroidism. After appropriate work confirming hypercalcemia as a result of PHPT, she was referred for surgery. She underwent surgery by Dr. Armandina Gemma on 12/03/21. Surgical out come: Left superior adenoma. Her post surgery labs show correction of calcium and PTH, until more recently when her PTH started to increase again at 44.  Her calcium remains controlled at 9.2.   She is status  post treatment for vitamin D deficiency currently replete at 42.7. Patient has no previously known  pituitary, adrenal dysfunctions; no family history of such dysfunctions. -She has normal renal function.  She did not have any recent bone density.  No prior history of fragility fractures or falls. No history of  kidney stones.  No history of CKD.   she is not on HCTZ or other thiazide therapy. she is not on calcium supplements,  she eats dairy and green, leafy, vegetables on average amounts.  she does not have a family history of hypercalcemia, pituitary tumors, thyroid cancer, or osteoporosis.  Her bone density in October 2021 was normal.  During recent trip to ER she underwent CT scan of head and neck which revealed multiple thyroid nodules.  She underwent thyroid ultrasound which confirms multiple nodules including 2.5 cm suspicious nodule in the left lobe.  Biopsy of the suspicious nodule prior to her recent visit was benign.    -She does not have any new complaints since last visit.   ROS: Limited as above.  PE: BP 138/76   Pulse 76   Ht '5\' 6"'$  (1.676 m)   Wt (!) 309 lb 12.8 oz (140.5 kg)   BMI 50.00 kg/m , Body mass index is 50 kg/m. Wt Readings from Last 3 Encounters:  09/09/22 (!) 309 lb 12.8 oz (140.5 kg)  02/22/22 (!) 314 lb 12.8 oz (142.8 kg)  12/02/21 (!) 307 lb (139.3 kg)     CMP ( most recent) CMP     Component Value Date/Time   NA 139 11/26/2021 1306   K 3.8 11/26/2021 1306   CL 105 11/26/2021 1306   CO2 28 11/26/2021 1306   GLUCOSE 117 (H) 11/26/2021 1306   BUN 15 11/26/2021 1306   CREATININE 0.74 11/26/2021 1306   CALCIUM 9.2 08/23/2022 0920   PROT 7.7 01/12/2013 1353   ALBUMIN 3.9 01/12/2013 1353   AST 15 01/12/2013 1353   ALT 10 01/12/2013 1353   ALKPHOS 103 01/12/2013 1353   BILITOT 0.4 01/12/2013 1353   GFRNONAA >60 11/26/2021 1306   GFRAA >60 07/04/2019 0905     Lab Results  Component Value Date   TSH 1.620 08/23/2022   TSH 2.760  02/16/2022   TSH 1.250 06/16/2021   TSH 1.730 02/15/2021   TSH 1.45 08/05/2020   TSH 1.38 05/20/2019   TSH 1.716 07/11/2013   FREET4 1.13 08/23/2022   FREET4 1.02 02/16/2022   FREET4 1.04 06/16/2021   FREET4 1.00 02/15/2021   FREET4 1.0 08/05/2020   FREET4 0.94 07/11/2013    May 20, 2019 PTH 119, calcium 10.7  Assessment: 1. Hypercalcemia / Hyperparathyroidism- resolved after surgery 2.  Multinodular goiter-benign FNA. 3.  Vitamin D deficiency  Plan: I discussed her recent labs, previous imaging studies and surgical  outcomes.  She returns with slight increase in her PTH to 66, while having normal calcium 9.2.   - Left superior parathyroid adenoma was removed during her previous surgery. She will not need further treatment at this time.    Her bone density is normal.  She will be kept on close follow-up with repeat labs in 6 months. - No apparent complications from hypercalcemia/hyperparathyroidism: no history of  nephrolithiasis,  osteoporosis, fragility fractures. No abdominal pain, no major mood disorders, no bone pain.   She will be continued on vitamin D3 2000 units daily.     She is advised to avoid any over-the-counter calcium supplements.   Regarding her multinodular goiter, she is status post biopsy of left lobe nodule with benign outcomes in October 2020.  She will not need any definitive antithyroid intervention at this time.  She will be considered for repeat follow-up ultrasound before her next visit.  She is advised to maintain close follow-up with her PCP.   I spent 26 minutes in the care of the patient today including review of labs from Thyroid Function, CMP, and other relevant labs ; imaging/biopsy records (current and previous including abstractions from other facilities); face-to-face time discussing  her lab results and symptoms, medications doses, her options of short and long term treatment based on the latest standards of care / guidelines;   and  documenting the encounter.  Ladine Kiper  participated in the discussions, expressed understanding, and voiced agreement with the above plans.  All questions were answered to her satisfaction. she is encouraged to contact clinic should she have any questions or concerns prior to her return visit.     - Return in about 1 year (around 09/10/2023) for Thyroid / Neck Ultrasound, F/U with Pre-visit Labs.   Glade Lloyd, MD [facility] [address] [place], [place] [address] Phone: [phone]  Fax: [phone]    This note was partially dictated with voice recognition software. Similar sounding words can be transcribed inadequately or may not  be corrected upon review.  09/09/2022, 2:40 PM

## 2022-09-19 ENCOUNTER — Ambulatory Visit (HOSPITAL_COMMUNITY)
Admission: RE | Admit: 2022-09-19 | Discharge: 2022-09-19 | Disposition: A | Discharge status: Home / Self Care | Payer: Medicare Other | Source: Ambulatory Visit | Attending: "Endocrinology | Admitting: "Endocrinology

## 2022-09-19 DIAGNOSIS — E042 Nontoxic multinodular goiter: Secondary | ICD-10-CM | POA: Insufficient documentation

## 2022-09-19 DIAGNOSIS — E010 Iodine-deficiency related diffuse (endemic) goiter: Secondary | ICD-10-CM | POA: Diagnosis not present

## 2022-10-04 ENCOUNTER — Telehealth: Payer: Self-pay | Admitting: "Endocrinology

## 2022-10-04 NOTE — Telephone Encounter (Signed)
Patient is asking for her ultrasound results. She had this done on 11/20

## 2022-10-05 NOTE — Telephone Encounter (Signed)
Discussed with pt, understanding voiced. 

## 2022-10-19 DIAGNOSIS — Z0000 Encounter for general adult medical examination without abnormal findings: Secondary | ICD-10-CM | POA: Diagnosis not present

## 2022-10-19 DIAGNOSIS — E1169 Type 2 diabetes mellitus with other specified complication: Secondary | ICD-10-CM | POA: Diagnosis not present

## 2022-10-29 DIAGNOSIS — I10 Essential (primary) hypertension: Secondary | ICD-10-CM | POA: Diagnosis not present

## 2022-10-29 DIAGNOSIS — E785 Hyperlipidemia, unspecified: Secondary | ICD-10-CM | POA: Diagnosis not present

## 2022-11-29 DIAGNOSIS — E785 Hyperlipidemia, unspecified: Secondary | ICD-10-CM | POA: Diagnosis not present

## 2022-11-29 DIAGNOSIS — I10 Essential (primary) hypertension: Secondary | ICD-10-CM | POA: Diagnosis not present

## 2022-12-06 DIAGNOSIS — M25552 Pain in left hip: Secondary | ICD-10-CM | POA: Diagnosis not present

## 2022-12-06 DIAGNOSIS — I480 Paroxysmal atrial fibrillation: Secondary | ICD-10-CM | POA: Diagnosis not present

## 2022-12-06 DIAGNOSIS — E559 Vitamin D deficiency, unspecified: Secondary | ICD-10-CM | POA: Diagnosis not present

## 2022-12-06 DIAGNOSIS — M150 Primary generalized (osteo)arthritis: Secondary | ICD-10-CM | POA: Diagnosis not present

## 2022-12-06 DIAGNOSIS — I10 Essential (primary) hypertension: Secondary | ICD-10-CM | POA: Diagnosis not present

## 2023-02-20 DIAGNOSIS — I480 Paroxysmal atrial fibrillation: Secondary | ICD-10-CM | POA: Diagnosis not present

## 2023-02-20 DIAGNOSIS — M1000 Idiopathic gout, unspecified site: Secondary | ICD-10-CM | POA: Diagnosis not present

## 2023-02-20 DIAGNOSIS — R7303 Prediabetes: Secondary | ICD-10-CM | POA: Diagnosis not present

## 2023-02-20 DIAGNOSIS — J441 Chronic obstructive pulmonary disease with (acute) exacerbation: Secondary | ICD-10-CM | POA: Diagnosis not present

## 2023-02-20 DIAGNOSIS — M62838 Other muscle spasm: Secondary | ICD-10-CM | POA: Diagnosis not present

## 2023-02-20 DIAGNOSIS — I10 Essential (primary) hypertension: Secondary | ICD-10-CM | POA: Diagnosis not present

## 2023-02-20 DIAGNOSIS — E782 Mixed hyperlipidemia: Secondary | ICD-10-CM | POA: Diagnosis not present

## 2023-02-20 DIAGNOSIS — Z7901 Long term (current) use of anticoagulants: Secondary | ICD-10-CM | POA: Diagnosis not present

## 2023-02-28 DIAGNOSIS — I10 Essential (primary) hypertension: Secondary | ICD-10-CM | POA: Diagnosis not present

## 2023-02-28 DIAGNOSIS — E782 Mixed hyperlipidemia: Secondary | ICD-10-CM | POA: Diagnosis not present

## 2023-04-24 ENCOUNTER — Other Ambulatory Visit: Payer: Self-pay | Admitting: Family Medicine

## 2023-04-24 ENCOUNTER — Other Ambulatory Visit (HOSPITAL_COMMUNITY)
Admission: RE | Admit: 2023-04-24 | Discharge: 2023-04-24 | Disposition: A | Discharge status: Home / Self Care | Payer: Medicare Other | Source: Ambulatory Visit | Attending: Family Medicine | Admitting: Family Medicine

## 2023-04-24 DIAGNOSIS — Z113 Encounter for screening for infections with a predominantly sexual mode of transmission: Secondary | ICD-10-CM | POA: Insufficient documentation

## 2023-04-24 DIAGNOSIS — M79662 Pain in left lower leg: Secondary | ICD-10-CM | POA: Diagnosis not present

## 2023-04-24 DIAGNOSIS — I10 Essential (primary) hypertension: Secondary | ICD-10-CM | POA: Diagnosis not present

## 2023-04-24 DIAGNOSIS — E212 Other hyperparathyroidism: Secondary | ICD-10-CM | POA: Diagnosis not present

## 2023-04-25 LAB — MOLECULAR ANCILLARY ONLY
Bacterial Vaginitis (gardnerella): NEGATIVE
Candida Glabrata: NEGATIVE
Candida Vaginitis: NEGATIVE
Comment: NEGATIVE
Comment: NEGATIVE
Comment: NEGATIVE
Comment: NEGATIVE
Trichomonas: NEGATIVE

## 2023-06-06 ENCOUNTER — Inpatient Hospital Stay (HOSPITAL_COMMUNITY)
Admission: EM | Admit: 2023-06-06 | Discharge: 2023-06-08 | DRG: 690 | Disposition: A | Discharge status: Home / Self Care | Payer: Medicare Other | Attending: Family Medicine | Admitting: Family Medicine

## 2023-06-06 ENCOUNTER — Encounter (HOSPITAL_COMMUNITY): Payer: Self-pay | Admitting: Emergency Medicine

## 2023-06-06 ENCOUNTER — Emergency Department (HOSPITAL_COMMUNITY): Payer: Medicare Other

## 2023-06-06 ENCOUNTER — Other Ambulatory Visit: Payer: Self-pay

## 2023-06-06 DIAGNOSIS — R509 Fever, unspecified: Secondary | ICD-10-CM | POA: Diagnosis not present

## 2023-06-06 DIAGNOSIS — Z825 Family history of asthma and other chronic lower respiratory diseases: Secondary | ICD-10-CM

## 2023-06-06 DIAGNOSIS — Z79899 Other long term (current) drug therapy: Secondary | ICD-10-CM

## 2023-06-06 DIAGNOSIS — I480 Paroxysmal atrial fibrillation: Secondary | ICD-10-CM | POA: Diagnosis not present

## 2023-06-06 DIAGNOSIS — I10 Essential (primary) hypertension: Secondary | ICD-10-CM | POA: Diagnosis present

## 2023-06-06 DIAGNOSIS — N3000 Acute cystitis without hematuria: Secondary | ICD-10-CM | POA: Diagnosis not present

## 2023-06-06 DIAGNOSIS — J449 Chronic obstructive pulmonary disease, unspecified: Secondary | ICD-10-CM | POA: Diagnosis present

## 2023-06-06 DIAGNOSIS — T502X5A Adverse effect of carbonic-anhydrase inhibitors, benzothiadiazides and other diuretics, initial encounter: Secondary | ICD-10-CM | POA: Diagnosis not present

## 2023-06-06 DIAGNOSIS — Z6841 Body Mass Index (BMI) 40.0 and over, adult: Secondary | ICD-10-CM

## 2023-06-06 DIAGNOSIS — I4891 Unspecified atrial fibrillation: Secondary | ICD-10-CM | POA: Insufficient documentation

## 2023-06-06 DIAGNOSIS — K5900 Constipation, unspecified: Secondary | ICD-10-CM | POA: Diagnosis present

## 2023-06-06 DIAGNOSIS — Z8249 Family history of ischemic heart disease and other diseases of the circulatory system: Secondary | ICD-10-CM | POA: Diagnosis not present

## 2023-06-06 DIAGNOSIS — R000 Tachycardia, unspecified: Secondary | ICD-10-CM | POA: Diagnosis not present

## 2023-06-06 DIAGNOSIS — R059 Cough, unspecified: Secondary | ICD-10-CM | POA: Diagnosis not present

## 2023-06-06 DIAGNOSIS — Z7901 Long term (current) use of anticoagulants: Secondary | ICD-10-CM | POA: Diagnosis not present

## 2023-06-06 DIAGNOSIS — Z811 Family history of alcohol abuse and dependence: Secondary | ICD-10-CM

## 2023-06-06 DIAGNOSIS — Z809 Family history of malignant neoplasm, unspecified: Secondary | ICD-10-CM | POA: Diagnosis not present

## 2023-06-06 DIAGNOSIS — E876 Hypokalemia: Secondary | ICD-10-CM | POA: Diagnosis not present

## 2023-06-06 DIAGNOSIS — Z8261 Family history of arthritis: Secondary | ICD-10-CM | POA: Diagnosis not present

## 2023-06-06 DIAGNOSIS — Z1152 Encounter for screening for COVID-19: Secondary | ICD-10-CM | POA: Diagnosis not present

## 2023-06-06 DIAGNOSIS — A419 Sepsis, unspecified organism: Secondary | ICD-10-CM | POA: Diagnosis not present

## 2023-06-06 DIAGNOSIS — N3001 Acute cystitis with hematuria: Secondary | ICD-10-CM | POA: Diagnosis not present

## 2023-06-06 DIAGNOSIS — N390 Urinary tract infection, site not specified: Secondary | ICD-10-CM | POA: Diagnosis present

## 2023-06-06 DIAGNOSIS — E785 Hyperlipidemia, unspecified: Secondary | ICD-10-CM | POA: Diagnosis present

## 2023-06-06 DIAGNOSIS — I350 Nonrheumatic aortic (valve) stenosis: Secondary | ICD-10-CM | POA: Diagnosis present

## 2023-06-06 DIAGNOSIS — I700 Atherosclerosis of aorta: Secondary | ICD-10-CM | POA: Diagnosis not present

## 2023-06-06 DIAGNOSIS — I517 Cardiomegaly: Secondary | ICD-10-CM | POA: Diagnosis not present

## 2023-06-06 DIAGNOSIS — R6510 Systemic inflammatory response syndrome (SIRS) of non-infectious origin without acute organ dysfunction: Secondary | ICD-10-CM | POA: Insufficient documentation

## 2023-06-06 DIAGNOSIS — R918 Other nonspecific abnormal finding of lung field: Secondary | ICD-10-CM | POA: Diagnosis not present

## 2023-06-06 LAB — RESP PANEL BY RT-PCR (RSV, FLU A&B, COVID)  RVPGX2
Influenza A by PCR: NEGATIVE
Influenza B by PCR: NEGATIVE
Resp Syncytial Virus by PCR: NEGATIVE
SARS Coronavirus 2 by RT PCR: NEGATIVE

## 2023-06-06 LAB — URINALYSIS, W/ REFLEX TO CULTURE (INFECTION SUSPECTED)
Bilirubin Urine: NEGATIVE
Glucose, UA: NEGATIVE mg/dL
Ketones, ur: NEGATIVE mg/dL
Nitrite: NEGATIVE
Protein, ur: NEGATIVE mg/dL
Specific Gravity, Urine: 1.006 (ref 1.005–1.030)
WBC, UA: >50 WBC/hpf (ref 0–5)
pH: 6 (ref 5.0–8.0)

## 2023-06-06 LAB — CBC WITH DIFFERENTIAL/PLATELET
Abs Immature Granulocytes: 0.04 10*3/uL (ref 0.00–0.07)
Basophils Absolute: 0 10*3/uL (ref 0.0–0.1)
Basophils Relative: 0 %
Eosinophils Absolute: 0 10*3/uL (ref 0.0–0.5)
Eosinophils Relative: 0 %
HCT: 35 % — ABNORMAL LOW (ref 36.0–46.0)
Hemoglobin: 11.1 g/dL — ABNORMAL LOW (ref 12.0–15.0)
Immature Granulocytes: 0 %
Lymphocytes Relative: 9 %
Lymphs Abs: 1 10*3/uL (ref 0.7–4.0)
MCH: 26.3 pg (ref 26.0–34.0)
MCHC: 31.7 g/dL (ref 30.0–36.0)
MCV: 82.9 fL (ref 80.0–100.0)
Monocytes Absolute: 1.5 10*3/uL — ABNORMAL HIGH (ref 0.1–1.0)
Monocytes Relative: 14 %
Neutro Abs: 8.2 10*3/uL — ABNORMAL HIGH (ref 1.7–7.7)
Neutrophils Relative %: 77 %
Platelets: 211 10*3/uL (ref 150–400)
RBC: 4.22 MIL/uL (ref 3.87–5.11)
RDW: 13.9 % (ref 11.5–15.5)
WBC: 10.7 10*3/uL — ABNORMAL HIGH (ref 4.0–10.5)
nRBC: 0 % (ref 0.0–0.2)

## 2023-06-06 LAB — COMPREHENSIVE METABOLIC PANEL
ALT: 23 U/L (ref 0–44)
AST: 28 U/L (ref 15–41)
Albumin: 3.2 g/dL — ABNORMAL LOW (ref 3.5–5.0)
Alkaline Phosphatase: 76 U/L (ref 38–126)
Anion gap: 12 (ref 5–15)
BUN: 15 mg/dL (ref 8–23)
CO2: 26 mmol/L (ref 22–32)
Calcium: 8.8 mg/dL — ABNORMAL LOW (ref 8.9–10.3)
Chloride: 99 mmol/L (ref 98–111)
Creatinine, Ser: 0.85 mg/dL (ref 0.44–1.00)
GFR, Estimated: >60 mL/min (ref >60)
Glucose, Bld: 130 mg/dL — ABNORMAL HIGH (ref 70–99)
Potassium: 2.8 mmol/L — ABNORMAL LOW (ref 3.5–5.1)
Sodium: 137 mmol/L (ref 135–145)
Total Bilirubin: 1.6 mg/dL — ABNORMAL HIGH (ref 0.3–1.2)
Total Protein: 7.5 g/dL (ref 6.5–8.1)

## 2023-06-06 LAB — CULTURE, BLOOD (ROUTINE X 2)
Special Requests: ADEQUATE
Special Requests: ADEQUATE

## 2023-06-06 LAB — BRAIN NATRIURETIC PEPTIDE: B Natriuretic Peptide: 110 pg/mL — ABNORMAL HIGH (ref 0.0–100.0)

## 2023-06-06 LAB — PROTIME-INR
INR: 1.5 — ABNORMAL HIGH (ref 0.8–1.2)
Prothrombin Time: 18 s — ABNORMAL HIGH (ref 11.4–15.2)

## 2023-06-06 LAB — APTT: aPTT: 41 s — ABNORMAL HIGH (ref 24–36)

## 2023-06-06 LAB — SARS CORONAVIRUS 2 BY RT PCR: SARS Coronavirus 2 by RT PCR: NEGATIVE

## 2023-06-06 LAB — MAGNESIUM: Magnesium: 1.8 mg/dL (ref 1.7–2.4)

## 2023-06-06 LAB — LACTIC ACID, PLASMA: Lactic Acid, Venous: 1.3 mmol/L (ref 0.5–1.9)

## 2023-06-06 MED ORDER — ACETAMINOPHEN 325 MG PO TABS
650.0000 mg | ORAL_TABLET | Freq: Once | ORAL | Status: AC
  Administered 2023-06-06: 650 mg via ORAL
  Filled 2023-06-06: qty 2

## 2023-06-06 MED ORDER — SODIUM CHLORIDE 0.9 % IV SOLN
2.0000 g | INTRAVENOUS | Status: DC
  Administered 2023-06-06 – 2023-06-07 (×2): 2 g via INTRAVENOUS
  Filled 2023-06-06 (×2): qty 20

## 2023-06-06 MED ORDER — POTASSIUM CHLORIDE CRYS ER 20 MEQ PO TBCR
40.0000 meq | EXTENDED_RELEASE_TABLET | Freq: Once | ORAL | Status: AC
  Administered 2023-06-06: 40 meq via ORAL
  Filled 2023-06-06: qty 2

## 2023-06-06 MED ORDER — SODIUM CHLORIDE 0.9 % IV SOLN
500.0000 mg | INTRAVENOUS | Status: DC
  Administered 2023-06-06: 500 mg via INTRAVENOUS
  Filled 2023-06-06: qty 5

## 2023-06-06 MED ORDER — LACTATED RINGERS IV BOLUS (SEPSIS)
1000.0000 mL | Freq: Once | INTRAVENOUS | Status: AC
  Administered 2023-06-06: 1000 mL via INTRAVENOUS

## 2023-06-06 MED ORDER — LACTATED RINGERS IV SOLN: INTRAVENOUS | Status: DC

## 2023-06-06 MED ORDER — LACTATED RINGERS IV BOLUS (SEPSIS)
800.0000 mL | Freq: Once | INTRAVENOUS | Status: AC
  Administered 2023-06-06: 800 mL via INTRAVENOUS

## 2023-06-06 MED ORDER — IOHEXOL 350 MG/ML SOLN
100.0000 mL | Freq: Once | INTRAVENOUS | Status: AC | PRN
  Administered 2023-06-06: 100 mL via INTRAVENOUS

## 2023-06-06 NOTE — ED Triage Notes (Signed)
Pt presents with chills, fatigue and fever since this weekend, last dose of Tylenol 10 am this morning.

## 2023-06-06 NOTE — ED Notes (Signed)
Pt walked to and from the restroom without assistance or difficulty  Reattached to full monitor  Pt unable to give UA, dropped cup in toilet

## 2023-06-06 NOTE — ED Provider Notes (Signed)
[place] EMERGENCY DEPARTMENT AT [facility] Provider Note   CSN: [id number] Arrival date & time: 06/06/23  1737     History  Chief Complaint  Patient presents with   Flu-Like Symptoms    Melissa Garner is a 67 y.o. female.  She has a history of COPD, retention, atrial fibrillation, status post parathyroidectomy due to hyperparathyroidism. To the ER for 2 days of chills, shivering, shortness of breath and feeling generally unwell.  HPI     Home Medications Prior to Admission medications   Medication Sig Start Date End Date Taking? Authorizing Provider  acetaminophen (TYLENOL) 500 MG tablet Take 1,000 mg by mouth every 8 (eight) hours as needed for moderate pain.    [provider]  albuterol (VENTOLIN HFA) 108 (90 Base) MCG/ACT inhaler Inhale 2 puffs into the lungs daily. 04/08/19   [provider]  amLODipine (NORVASC) 10 MG tablet Take 1 tablet (10 mg total) by mouth daily. 10/04/18   Triplett, Tammy, PA-C  apixaban (ELIQUIS) 5 MG TABS tablet Take 5 mg by mouth 2 (two) times daily.    [provider]  atorvastatin (LIPITOR) 20 MG tablet Take 20 mg by mouth daily. 06/15/20   [provider]  Cholecalciferol 50 MCG (2000 UT) CAPS Take 1 capsule (2,000 Units total) by mouth daily with breakfast. 02/17/21   Nida, Denman George, MD  clobetasol cream (TEMOVATE) 0.05 % Apply 1 application topically daily as needed (breakouts). 07/29/20   [provider]  diclofenac sodium (VOLTAREN) 1 % GEL Apply 1 application topically daily as needed (pain). 01/22/19   [provider]  lisinopril-hydrochlorothiazide (PRINZIDE,ZESTORETIC) 20-12.5 MG tablet Take 1 tablet by mouth daily. 10/04/18   Triplett, Tammy, PA-C  meloxicam (MOBIC) 7.5 MG tablet Take 1 tablet by mouth once daily Patient not taking: Reported on 09/09/2022 02/28/22   Vickki Hearing, MD  ondansetron (ZOFRAN-ODT) 4 MG disintegrating tablet Take 1 tablet (4 mg total) by  mouth every 8 (eight) hours as needed for nausea or vomiting. Patient not taking: Reported on 09/09/2022 10/29/21   Particia Nearing, PA-C  traMADol (ULTRAM) 50 MG tablet Take 1-2 tablets (50-100 mg total) by mouth every 6 (six) hours as needed for moderate pain. Patient not taking: Reported on 09/09/2022 12/02/21   Darnell Level, MD      Allergies    Patient has no known allergies.    Review of Systems   Review of Systems  Physical Exam Updated Vital Signs BP 121/75   Pulse 100   Temp 98.2 F (36.8 C) (Oral)   Resp 20   Ht 5\' 6"  (1.676 m)   Wt 136.1 kg   SpO2 96%   BMI 48.42 kg/m  Physical Exam Vitals and nursing note reviewed.  Constitutional:      General: She is not in acute distress.    Appearance: She is well-developed.  HENT:     Head: Normocephalic and atraumatic.     Mouth/Throat:     Mouth: Mucous membranes are moist.  Eyes:     Conjunctiva/sclera: Conjunctivae normal.  Cardiovascular:     Rate and Rhythm: Normal rate and regular rhythm.     Heart sounds: No murmur heard. Pulmonary:     Effort: Pulmonary effort is normal. No respiratory distress.     Breath sounds: Normal breath sounds. No wheezing or rhonchi.  Abdominal:     Palpations: Abdomen is soft.     Tenderness: There is no abdominal tenderness. There is no right  CVA tenderness, left CVA tenderness or rebound.  Musculoskeletal:        General: No swelling.     Cervical back: Neck supple.  Skin:    General: Skin is warm and dry.     Capillary Refill: Capillary refill takes less than 2 seconds.  Neurological:     Mental Status: She is alert.  Psychiatric:        Mood and Affect: Mood normal.     ED Results / Procedures / Treatments   Labs (all labs ordered are listed, but only abnormal results are displayed) Labs Reviewed  COMPREHENSIVE METABOLIC PANEL - Abnormal; Notable for the following components:      Result Value   Potassium 2.8 (*)    Glucose, Bld 130 (*)    Calcium 8.8 (*)     Albumin 3.2 (*)    Total Bilirubin 1.6 (*)    All other components within normal limits  CBC WITH DIFFERENTIAL/PLATELET - Abnormal; Notable for the following components:   WBC 10.7 (*)    Hemoglobin 11.1 (*)    HCT 35.0 (*)    Neutro Abs 8.2 (*)    Monocytes Absolute 1.5 (*)    All other components within normal limits  PROTIME-INR - Abnormal; Notable for the following components:   Prothrombin Time 18.0 (*)    INR 1.5 (*)    All other components within normal limits  APTT - Abnormal; Notable for the following components:   aPTT 41 (*)    All other components within normal limits  URINALYSIS, W/ REFLEX TO CULTURE (INFECTION SUSPECTED) - Abnormal; Notable for the following components:   APPearance CLOUDY (*)    Hgb urine dipstick MODERATE (*)    Leukocytes,Ua LARGE (*)    Bacteria, UA RARE (*)    Non Squamous Epithelial 0-5 (*)    All other components within normal limits  BRAIN NATRIURETIC PEPTIDE - Abnormal; Notable for the following components:   B Natriuretic Peptide 110.0 (*)    All other components within normal limits  SARS CORONAVIRUS 2 BY RT PCR  RESP PANEL BY RT-PCR (RSV, FLU A&B, COVID)  [id number]  CULTURE, BLOOD (ROUTINE X 2)  CULTURE, BLOOD (ROUTINE X 2)  URINE CULTURE  LACTIC ACID, PLASMA  MAGNESIUM    EKG EKG Interpretation Date/Time:  Tuesday June 06 2023 20:10:06 EDT Ventricular Rate:  96 PR Interval:    QRS Duration:  91 QT Interval:  320 QTC Calculation: 405 R Axis:   55  Text Interpretation: Atrial fibrillation Nonspecific T abnormalities, diffuse leads Nonspecific ST and T wave abnormality TWI are new Confirmed by Derwood Kaplan [id number]) on 06/06/2023 10:37:07 PM  Radiology CT Angio Chest PE W and/or Wo Contrast  Result Date: 06/06/2023 CLINICAL DATA:  Chills, fatigue, fever EXAM: CT ANGIOGRAPHY CHEST WITH CONTRAST TECHNIQUE: Multidetector CT imaging of the chest was performed using the standard protocol during bolus administration of intravenous  contrast. Multiplanar CT image reconstructions and MIPs were obtained to evaluate the vascular anatomy. RADIATION DOSE REDUCTION: This exam was performed according to the departmental dose-optimization program which includes automated exposure control, adjustment of the mA and/or kV according to patient size and/or use of iterative reconstruction technique. CONTRAST:  OMNIPAQUE IOHEXOL 350 MG/ML SOLN COMPARISON:  None Available. FINDINGS: Cardiovascular: Satisfactory opacification the bilateral pulmonary arteries to the segmental level. No evidence of pulmonary embolism. Although not tailored for evaluation of the thoracic aorta, there is no evidence thoracic aortic aneurysm or dissection. Mild atherosclerotic calcifications. Mild  cardiomegaly.  No pericardial effusion. Mediastinum/Nodes: No suspicious mediastinal lymphadenopathy. Multiple bilateral thyroid nodules, left greater than right, measuring up to 3.4 cm on the left (series 4/image 9). These have been previously evaluated on thyroid ultrasound and biopsy. This has been evaluated on previous imaging. (ref: J Am Coll Radiol. 2015 Feb;12(2): 143-50). Lungs/Pleura: Lungs are essentially clear. No suspicious pulmonary nodules. No focal consolidation. Very mild mosaic attenuation in the bilateral lower lobes, favoring atelectasis over air trapping. No pleural effusion or pneumothorax. Upper Abdomen: Visualized upper abdomen is grossly unremarkable. Musculoskeletal: Degenerative changes of the visualized thoracolumbar spine. Review of the MIP images confirms the above findings. IMPRESSION: No evidence of pulmonary embolism. Mild cardiomegaly. No acute cardiopulmonary disease. Aortic Atherosclerosis (ICD10-I70.0). Electronically Signed   By: Charline Bills M.D.   On: 06/06/2023 23:24   DG Chest 2 View  Result Date: 06/06/2023 CLINICAL DATA:  Cough EXAM: CHEST - 2 VIEW COMPARISON:  Chest x-ray dated September 11, 2020 FINDINGS: The heart size and  mediastinal contours are within normal limits. New mild focal retrocardiac opacity. Both lungs are otherwise clear. The visualized skeletal structures are unremarkable. IMPRESSION: New mild focal retrocardiac opacity, which may represent atelectasis or infection. Recommend follow-up PA and lateral chest radiograph in 6-8 weeks to ensure resolution. Electronically Signed   By: Allegra Lai M.D.   On: 06/06/2023 18:20    Procedures Procedures    Medications Ordered in ED Medications  lactated ringers infusion ( Intravenous New Bag/Given 06/06/23 2144)  cefTRIAXone (ROCEPHIN) 2 g in sodium chloride 0.9 % 100 mL IVPB (0 g Intravenous Stopped 06/06/23 2011)  azithromycin (ZITHROMAX) 500 mg in sodium chloride 0.9 % 250 mL IVPB (0 mg Intravenous Stopped 06/06/23 2136)  acetaminophen (TYLENOL) tablet 650 mg (650 mg Oral Given 06/06/23 1749)  lactated ringers bolus 1,000 mL (0 mLs Intravenous Stopped 06/06/23 2237)    And  lactated ringers bolus 800 mL (0 mLs Intravenous Stopped 06/06/23 2237)  potassium chloride SA (KLOR-CON M) CR tablet 40 mEq (40 mEq Oral Given 06/06/23 2145)  iohexol (OMNIPAQUE) 350 MG/ML injection 100 mL (100 mLs Intravenous Contrast Given 06/06/23 2312)    ED Course/ Medical Decision Making/ A&P                                 Medical Decision Making This patient presents to the ED for concern of dysuria, frequency, fever, chills, this involves an extensive number of treatment options, and is a complaint that carries with it a high risk of complications and morbidity.  The differential diagnosis includes sepsis, pneumonia, UTI, kidney stone, PE, other   Co morbidities that complicate the patient evaluation :   COPD   Additional history obtained:  Additional history obtained from EMR External records from outside source obtained and reviewed including notes   Lab Tests:  I Ordered, and personally interpreted labs.  The pertinent results include: Leukocytosis, UTI, negative  COVID flu RSV, hypokalemia 2.8   Imaging Studies ordered:  I ordered imaging studies including chest x-ray I independently visualized and interpreted imaging which showed possible retrocardiac opacity, I agree with the radiologist interpretation   Cardiac Monitoring: / EKG:  The patient was maintained on a cardiac monitor.  I personally viewed and interpreted the cardiac monitored which showed an underlying rhythm of: Atrial fibrillation   Consultations Obtained:  I requested consultation with the hospitalist,  and discussed lab and imaging findings as well as pertinent  plan - they recommend: Admission   Problem List / ED Course / Critical interventions / Medication management  Sepsis-patient does not meet severe sepsis.  He does have UTI, initially treated for pneumonia as well because of chest x-ray finding but no obvious pneumonia on her CT of her chest, no PE.  Sats are 96% on room air.  Patient had some shortness of breath when she went to the bathroom, she states she does have intermittent shortness of breath and chronic cough so this was not out of her usual.  Discussed with hospitalist for admission for urosepsis. I ordered medication including Tylenol for fever  Reevaluation of the patient after these medicines showed that the patient improved I have reviewed the patients home medicines and have made adjustments as needed      Amount and/or Complexity of Data Reviewed Labs: ordered. Radiology: ordered. ECG/medicine tests: ordered.  Risk Prescription drug management. Decision regarding hospitalization.           Final Clinical Impression(s) / ED Diagnoses Final diagnoses:  Acute cystitis with hematuria  Sepsis without acute organ dysfunction, due to unspecified organism [facility])    Rx / DC Orders ED Discharge Orders     None         Ma Rings, PA-C 06/07/23 0003    Derwood Kaplan, MD 06/10/23 [phone]

## 2023-06-06 NOTE — ED Notes (Signed)
Pt stated she has chills and a fever for a few days. Takes tylenol at home.   Denies CP and SOB Noted temp in triage  Temp rechecked WNL

## 2023-06-06 NOTE — ED Notes (Signed)
Pt ambulated to and from restroom, missed cup. Will try later for UA.   SOB back at bedside.  Pt stated that she get SOB on exertion from time to time.   Pt placed back on full monitor

## 2023-06-06 NOTE — Sepsis Progress Note (Signed)
Elink monitoring for the code sepsis protocol.  

## 2023-06-07 ENCOUNTER — Encounter (HOSPITAL_COMMUNITY): Payer: Self-pay | Admitting: Family Medicine

## 2023-06-07 DIAGNOSIS — Z79899 Other long term (current) drug therapy: Secondary | ICD-10-CM | POA: Diagnosis not present

## 2023-06-07 DIAGNOSIS — Z8261 Family history of arthritis: Secondary | ICD-10-CM | POA: Diagnosis not present

## 2023-06-07 DIAGNOSIS — Z811 Family history of alcohol abuse and dependence: Secondary | ICD-10-CM | POA: Diagnosis not present

## 2023-06-07 DIAGNOSIS — I10 Essential (primary) hypertension: Secondary | ICD-10-CM

## 2023-06-07 DIAGNOSIS — Z1152 Encounter for screening for COVID-19: Secondary | ICD-10-CM | POA: Diagnosis not present

## 2023-06-07 DIAGNOSIS — Z8249 Family history of ischemic heart disease and other diseases of the circulatory system: Secondary | ICD-10-CM | POA: Diagnosis not present

## 2023-06-07 DIAGNOSIS — E785 Hyperlipidemia, unspecified: Secondary | ICD-10-CM | POA: Diagnosis not present

## 2023-06-07 DIAGNOSIS — Z7901 Long term (current) use of anticoagulants: Secondary | ICD-10-CM | POA: Diagnosis not present

## 2023-06-07 DIAGNOSIS — E876 Hypokalemia: Secondary | ICD-10-CM | POA: Diagnosis not present

## 2023-06-07 DIAGNOSIS — K5900 Constipation, unspecified: Secondary | ICD-10-CM | POA: Diagnosis present

## 2023-06-07 DIAGNOSIS — Z825 Family history of asthma and other chronic lower respiratory diseases: Secondary | ICD-10-CM | POA: Diagnosis not present

## 2023-06-07 DIAGNOSIS — I4891 Unspecified atrial fibrillation: Secondary | ICD-10-CM | POA: Diagnosis not present

## 2023-06-07 DIAGNOSIS — Z809 Family history of malignant neoplasm, unspecified: Secondary | ICD-10-CM | POA: Diagnosis not present

## 2023-06-07 DIAGNOSIS — J449 Chronic obstructive pulmonary disease, unspecified: Secondary | ICD-10-CM | POA: Diagnosis present

## 2023-06-07 DIAGNOSIS — I350 Nonrheumatic aortic (valve) stenosis: Secondary | ICD-10-CM | POA: Diagnosis present

## 2023-06-07 DIAGNOSIS — I480 Paroxysmal atrial fibrillation: Secondary | ICD-10-CM | POA: Diagnosis not present

## 2023-06-07 DIAGNOSIS — T502X5A Adverse effect of carbonic-anhydrase inhibitors, benzothiadiazides and other diuretics, initial encounter: Secondary | ICD-10-CM | POA: Diagnosis present

## 2023-06-07 DIAGNOSIS — N390 Urinary tract infection, site not specified: Secondary | ICD-10-CM | POA: Diagnosis present

## 2023-06-07 DIAGNOSIS — N3000 Acute cystitis without hematuria: Secondary | ICD-10-CM

## 2023-06-07 DIAGNOSIS — R6510 Systemic inflammatory response syndrome (SIRS) of non-infectious origin without acute organ dysfunction: Secondary | ICD-10-CM

## 2023-06-07 DIAGNOSIS — Z6841 Body Mass Index (BMI) 40.0 and over, adult: Secondary | ICD-10-CM | POA: Diagnosis not present

## 2023-06-07 DIAGNOSIS — N3001 Acute cystitis with hematuria: Secondary | ICD-10-CM | POA: Diagnosis present

## 2023-06-07 LAB — COMPREHENSIVE METABOLIC PANEL
ALT: 22 U/L (ref 0–44)
AST: 25 U/L (ref 15–41)
Albumin: 2.9 g/dL — ABNORMAL LOW (ref 3.5–5.0)
Alkaline Phosphatase: 69 U/L (ref 38–126)
Anion gap: 8 (ref 5–15)
BUN: 11 mg/dL (ref 8–23)
CO2: 29 mmol/L (ref 22–32)
Calcium: 8.4 mg/dL — ABNORMAL LOW (ref 8.9–10.3)
Chloride: 101 mmol/L (ref 98–111)
Creatinine, Ser: 0.79 mg/dL (ref 0.44–1.00)
GFR, Estimated: >60 mL/min (ref >60)
Glucose, Bld: 131 mg/dL — ABNORMAL HIGH (ref 70–99)
Potassium: 3.5 mmol/L (ref 3.5–5.1)
Sodium: 138 mmol/L (ref 135–145)
Total Bilirubin: 1.2 mg/dL (ref 0.3–1.2)
Total Protein: 6.9 g/dL (ref 6.5–8.1)

## 2023-06-07 LAB — CBC WITH DIFFERENTIAL/PLATELET
Abs Immature Granulocytes: 0.02 10*3/uL (ref 0.00–0.07)
Basophils Absolute: 0 10*3/uL (ref 0.0–0.1)
Basophils Relative: 0 %
Eosinophils Absolute: 0 10*3/uL (ref 0.0–0.5)
Eosinophils Relative: 0 %
HCT: 34 % — ABNORMAL LOW (ref 36.0–46.0)
Hemoglobin: 10.4 g/dL — ABNORMAL LOW (ref 12.0–15.0)
Immature Granulocytes: 0 %
Lymphocytes Relative: 14 %
Lymphs Abs: 1 10*3/uL (ref 0.7–4.0)
MCH: 26 pg (ref 26.0–34.0)
MCHC: 30.6 g/dL (ref 30.0–36.0)
MCV: 85 fL (ref 80.0–100.0)
Monocytes Absolute: 1.4 10*3/uL — ABNORMAL HIGH (ref 0.1–1.0)
Monocytes Relative: 18 %
Neutro Abs: 4.9 10*3/uL (ref 1.7–7.7)
Neutrophils Relative %: 68 %
Platelets: 213 10*3/uL (ref 150–400)
RBC: 4 MIL/uL (ref 3.87–5.11)
RDW: 13.9 % (ref 11.5–15.5)
WBC: 7.3 10*3/uL (ref 4.0–10.5)
nRBC: 0 % (ref 0.0–0.2)

## 2023-06-07 LAB — MAGNESIUM: Magnesium: 1.7 mg/dL (ref 1.7–2.4)

## 2023-06-07 LAB — TSH: TSH: 1.409 u[IU]/mL (ref 0.350–4.500)

## 2023-06-07 LAB — HIV ANTIBODY (ROUTINE TESTING W REFLEX): HIV Screen 4th Generation wRfx: NONREACTIVE

## 2023-06-07 MED ORDER — POTASSIUM CHLORIDE 10 MEQ/100ML IV SOLN
10.0000 meq | INTRAVENOUS | Status: AC
  Administered 2023-06-07 (×3): 10 meq via INTRAVENOUS
  Filled 2023-06-07 (×3): qty 100

## 2023-06-07 MED ORDER — SENNOSIDES-DOCUSATE SODIUM 8.6-50 MG PO TABS
2.0000 | ORAL_TABLET | Freq: Every day | ORAL | Status: DC
  Administered 2023-06-07: 2 via ORAL
  Filled 2023-06-07: qty 2

## 2023-06-07 MED ORDER — POTASSIUM CHLORIDE CRYS ER 20 MEQ PO TBCR
40.0000 meq | EXTENDED_RELEASE_TABLET | Freq: Once | ORAL | Status: AC
  Administered 2023-06-07: 40 meq via ORAL
  Filled 2023-06-07: qty 2

## 2023-06-07 MED ORDER — ATORVASTATIN CALCIUM 20 MG PO TABS
20.0000 mg | ORAL_TABLET | Freq: Every day | ORAL | Status: DC
  Administered 2023-06-07 – 2023-06-08 (×2): 20 mg via ORAL
  Filled 2023-06-07 (×2): qty 1

## 2023-06-07 MED ORDER — ACETAMINOPHEN 650 MG RE SUPP: 650.0000 mg | Freq: Four times a day (QID) | RECTAL | Status: DC | PRN

## 2023-06-07 MED ORDER — MAGNESIUM OXIDE -MG SUPPLEMENT 400 (240 MG) MG PO TABS
400.0000 mg | ORAL_TABLET | Freq: Two times a day (BID) | ORAL | Status: DC
  Administered 2023-06-07 – 2023-06-08 (×3): 400 mg via ORAL
  Filled 2023-06-07 (×3): qty 1

## 2023-06-07 MED ORDER — APIXABAN 5 MG PO TABS
5.0000 mg | ORAL_TABLET | Freq: Two times a day (BID) | ORAL | Status: DC
  Administered 2023-06-07 – 2023-06-08 (×3): 5 mg via ORAL
  Filled 2023-06-07 (×3): qty 1

## 2023-06-07 MED ORDER — OXYCODONE HCL 5 MG PO TABS: 5.0000 mg | ORAL_TABLET | ORAL | Status: DC | PRN

## 2023-06-07 MED ORDER — POLYETHYLENE GLYCOL 3350 17 G PO PACK
17.0000 g | PACK | Freq: Two times a day (BID) | ORAL | Status: DC
  Administered 2023-06-07 (×2): 17 g via ORAL
  Filled 2023-06-07 (×3): qty 1

## 2023-06-07 MED ORDER — SODIUM CHLORIDE 0.9 % IV SOLN: INTRAVENOUS | Status: DC

## 2023-06-07 MED ORDER — POTASSIUM CHLORIDE 20 MEQ PO PACK
40.0000 meq | PACK | Freq: Once | ORAL | Status: AC
  Administered 2023-06-07: 40 meq via ORAL
  Filled 2023-06-07: qty 2

## 2023-06-07 MED ORDER — AMLODIPINE BESYLATE 5 MG PO TABS
10.0000 mg | ORAL_TABLET | Freq: Every day | ORAL | Status: DC
  Administered 2023-06-07 – 2023-06-08 (×2): 10 mg via ORAL
  Filled 2023-06-07 (×2): qty 2

## 2023-06-07 MED ORDER — ACETAMINOPHEN 325 MG PO TABS
650.0000 mg | ORAL_TABLET | Freq: Four times a day (QID) | ORAL | Status: DC | PRN
  Administered 2023-06-07: 650 mg via ORAL
  Filled 2023-06-07: qty 2

## 2023-06-07 MED ORDER — ONDANSETRON HCL 4 MG PO TABS: 4.0000 mg | ORAL_TABLET | Freq: Four times a day (QID) | ORAL | Status: DC | PRN

## 2023-06-07 MED ORDER — ONDANSETRON HCL 4 MG/2ML IJ SOLN
4.0000 mg | Freq: Four times a day (QID) | INTRAMUSCULAR | Status: DC | PRN
  Administered 2023-06-07: 4 mg via INTRAVENOUS
  Filled 2023-06-07: qty 2

## 2023-06-07 NOTE — Plan of Care (Signed)
  Problem: Education: Goal: Knowledge of General Education information will improve Description: Including pain rating scale, medication(s)/side effects and non-pharmacologic comfort measures Outcome: Progressing   Problem: Health Behavior/Discharge Planning: Goal: Ability to manage health-related needs will improve Outcome: Progressing   Problem: Clinical Measurements: Goal: Ability to maintain clinical measurements within normal limits will improve Outcome: Progressing   Problem: Clinical Measurements: Goal: Respiratory complications will improve Outcome: Not Met (add Reason)

## 2023-06-07 NOTE — TOC CM/SW Note (Signed)
Transition of Care [facility]) - Inpatient Brief Assessment   Patient Details  Name: Melissa Garner MRN: [id number] Date of Birth: 1956/10/01  [facility] CM/SW Contact:    Villa Herb, LCSWA Phone Number: 06/07/2023, 10:04 AM   Clinical Narrative: Transition of Care Department [facility]) has reviewed patient and no TOC needs have been identified at this time. We will continue to monitor patient advancement through interdisciplinary progression rounds. If new patient transition needs arise, please place a TOC consult.  Transition of Care Asessment: Insurance and Status: Insurance coverage has been reviewed Patient has primary care physician: Yes Home environment has been reviewed: from home Prior level of function:: independent Prior/Current Home Services: No current home services Social Determinants of Health Reivew: SDOH reviewed no interventions necessary Readmission risk has been reviewed: Yes Transition of care needs: no transition of care needs at this time

## 2023-06-07 NOTE — Assessment & Plan Note (Signed)
continue Eliquis Currently bradycardic.  Discussed with family at this point would like to concentrate on comfort discontinue telemetry   

## 2023-06-07 NOTE — H&P (Signed)
History and Physical    Patient: Melissa Garner WNU:[id number] DOB: [birth year] DOA: 06/06/2023 DOS: the patient was seen and examined on 06/07/2023 PCP: Mirna Mires, MD  Patient coming from: Home  Chief Complaint:  Chief Complaint  Patient presents with   Flu-Like Symptoms   HPI: [name] is a 67 y.o. female with medical history significant of COPD, hypertension, hyperlipidemia, obesity, parathyroid abnormality, A-fib, and more presents the ED with a chief complaint of cold chills.  Patient reports that she felt feverish and had tingling fingers.  She reports that this started on Sunday.  She had had pelvic pain and urinary incontinence as well.  Her urinary incontinence may have started 3 weeks ago.  All the symptoms are gradual in onset.  She went to a doctor and she was prescribed medication, but reports she does not remember what it was.  From the description it sounds like it could have been Diflucan.  Patient reports that it was not a Media planner.  She reports that she is not sure what her Tmax was at home.  She did take Tylenol and that helped her fever.  It did not help her pelvic pain.  Her pelvic pain radiates around to her back.  She was concerned it could be her kidneys.  She has noticed her urine has been darker.  She describes her pain as sharp.  She has had a decreased appetite as well.  Patient has no other complaints at this time.  Patient does not smoke, does not drink.  She is full code. Review of Systems: As mentioned in the history of present illness. All other systems reviewed and are negative. Past Medical History:  Diagnosis Date   Aortic stenosis    COPD (chronic obstructive pulmonary disease) (HCC)    Essential hypertension    Hyperlipidemia    Lumbar disc disease    Left leg pain   Obesity    Osteoarthritis    Parathyroid abnormality (HCC)    Paroxysmal atrial fibrillation (HCC)    Upper respiratory infection    Past Surgical History:   Procedure Laterality Date   ABDOMINAL HYSTERECTOMY  1989   Partial   CARPAL TUNNEL RELEASE Bilateral 2005   COLONOSCOPY N/A 01/14/2016   Procedure: COLONOSCOPY;  Surgeon: West Bali, MD;  Location: [facility];  Service: Endoscopy;  Laterality: N/A;  230    KNEE SURGERY Left 2008   total   PARATHYROIDECTOMY Left 12/02/2021   Procedure: LEFT PARATHYROIDECTOMY;  Surgeon: Darnell Level, MD;  Location: [facility] ORS;  Service: General;  Laterality: Left;   Social History:  reports that she has never smoked. She has never used smokeless tobacco. She reports that she does not drink alcohol and does not use drugs.  No Known Allergies  Family History  Problem Relation Age of Onset   Heart failure Mother    Alcoholism Father    Cancer Father    COPD Sister    Hypertension Sister    Arthritis Sister    Seizures Sister    Hypertension Brother    Arthritis Brother    Hypertension Brother    Arthritis Brother    Hypertension Brother    Arthritis Brother    Hypertension Sister    Hypertension Sister    COPD Sister    Hypertension Daughter    Hypertension Son    Crohn's disease Son    Colon cancer Neg Hx     Prior to Admission medications   Medication Sig  Start Date End Date Taking? Authorizing Provider  acetaminophen (TYLENOL) 500 MG tablet Take 1,000 mg by mouth every 8 (eight) hours as needed for moderate pain.    [provider]  albuterol (VENTOLIN HFA) 108 (90 Base) MCG/ACT inhaler Inhale 2 puffs into the lungs daily. 04/08/19   [provider]  amLODipine (NORVASC) 10 MG tablet Take 1 tablet (10 mg total) by mouth daily. 10/04/18   Triplett, Tammy, PA-C  apixaban (ELIQUIS) 5 MG TABS tablet Take 5 mg by mouth 2 (two) times daily.    [provider]  atorvastatin (LIPITOR) 20 MG tablet Take 20 mg by mouth daily. 06/15/20   [provider]  Cholecalciferol 50 MCG (2000 UT) CAPS Take 1 capsule (2,000 Units total) by mouth daily with breakfast. 02/17/21    Nida, Denman George, MD  clobetasol cream (TEMOVATE) 0.05 % Apply 1 application topically daily as needed (breakouts). 07/29/20   [provider]  diclofenac sodium (VOLTAREN) 1 % GEL Apply 1 application topically daily as needed (pain). 01/22/19   [provider]  lisinopril-hydrochlorothiazide (PRINZIDE,ZESTORETIC) 20-12.5 MG tablet Take 1 tablet by mouth daily. 10/04/18   Triplett, Tammy, PA-C  meloxicam (MOBIC) 7.5 MG tablet Take 1 tablet by mouth once daily Patient not taking: Reported on 09/09/2022 02/28/22   Vickki Hearing, MD  ondansetron (ZOFRAN-ODT) 4 MG disintegrating tablet Take 1 tablet (4 mg total) by mouth every 8 (eight) hours as needed for nausea or vomiting. Patient not taking: Reported on 09/09/2022 10/29/21   Particia Nearing, PA-C  traMADol (ULTRAM) 50 MG tablet Take 1-2 tablets (50-100 mg total) by mouth every 6 (six) hours as needed for moderate pain. Patient not taking: Reported on 09/09/2022 12/02/21   Darnell Level, MD    Physical Exam: Vitals:   06/07/23 0000 06/07/23 0019 06/07/23 0045 06/07/23 0248  BP: 124/77  135/78 (!) 149/81  Pulse: 96  89 (!) 108  Resp: 18  19 20   Temp:  98.4 F (36.9 C)  (!) 100.4 F (38 C)  TempSrc:  Oral  Oral  SpO2: 97%  96% 99%  Weight:      Height:       1.  General: Patient lying supine in bed,  no acute distress   2. Psychiatric: Alert and oriented x 3, mood and behavior normal for situation, pleasant and cooperative with exam   3. Neurologic: Speech and language are normal, face is symmetric, moves all 4 extremities voluntarily, at baseline without acute deficits on limited exam   4. HEENMT:  Head is atraumatic, normocephalic, pupils reactive to light, neck is supple, trachea is midline, mucous membranes are moist   5. Respiratory : Lungs are clear to auscultation bilaterally without wheezing, rhonchi, rales, no cyanosis, no increase in work of breathing or accessory muscle use   6.  Cardiovascular : Heart rate normal, rhythm is regular, no murmurs, rubs or gallops, chronic peripheral edema, peripheral pulses palpated   7. Gastrointestinal:  Abdomen is soft, nondistended, nontender to palpation bowel sounds active, no masses or organomegaly palpated   8. Skin:  Skin is warm, dry and intact without rashes, acute lesions, or ulcers on limited exam   9.Musculoskeletal:  No acute deformities or trauma, no asymmetry in tone, chronic peripheral edema, peripheral pulses palpated, no tenderness to palpation in the extremities  Data Reviewed: In the ED Patient met SIRS criteria but without endorgan damage UA was indicative of UTI There was a concern for pneumonia but CTA did not  show any pneumonia A-fib was on EKG but patient does have history of A-fib Patient was started on Rocephin and Zithromax Admission was requested for SIRS  Assessment and Plan: * UTI (urinary tract infection) -UA indicative of UTI -symptomatic with pelvic pain, dysuria, and urinary incontinence -Urine culture pending -Continue Rocephin -Blood cultures pending -No leukocytosis -Continue to monitor  A-fib (HCC) -continue Eliquis  Hypokalemia -K + 2.8 - in ED -trend in the AM -mag in the AM -Continue to monitor  SIRS (systemic inflammatory response syndrome) (HCC) -T 100.7, HR 114, R 20 -Ua indicated UTI -CXR shows possible pneumonia, but not demonstrated on CTA -Urine culture pending -Blood culture pending -lactic acid 1.3 -Continue to monitor  HTN (hypertension) -Continue amlodipine   HLD (hyperlipidemia) -Continue Statin      Advance Care Planning:   Code Status: Full Code  Consults: No consults at this time  Family Communication: No family at bedside  Severity of Illness: The appropriate patient status for this patient is OBSERVATION. Observation status is judged to be reasonable and necessary in order to provide the required intensity of service to ensure  the patient's safety. The patient's presenting symptoms, physical exam findings, and initial radiographic and laboratory data in the context of their medical condition is felt to place them at decreased risk for further clinical deterioration. Furthermore, it is anticipated that the patient will be medically stable for discharge from the hospital within 2 midnights of admission.   Author: Lilyan Gilford, DO 06/07/2023 5:58 AM  For on call review [email or web address].

## 2023-06-07 NOTE — Assessment & Plan Note (Signed)
-   Continue amlodipine ?

## 2023-06-07 NOTE — Assessment & Plan Note (Signed)
-  K + 2.8 - in ED -trend in the AM -mag in the AM -Continue to monitor

## 2023-06-07 NOTE — Progress Notes (Signed)
Patient seen and evaluated, chart reviewed, please see EMR for updated orders. Please see full H&P dictated by admitting physician Dr Camillo Flaming for same date of service.   -- Brief Summary:-  67 y.o. female with medical history significant of COPD, hypertension, hyperlipidemia, obesity, parathyroid abnormality, A-fib admitted on 06/07/2023 with presumed sepsis from urinary source  A/p 1)Sepsis secondary to urinary Source--- continue IV Rocephin pending further culture data  2)PAFib--- continue Eliquis  3)HTN--stable, hold lisinopril hydrochlorothiazide -Continue amlodipine  4)HLD-continue atorvastatin  5)Constipation---start  Senokot and MiraLAX  - Patient seen and evaluated, chart reviewed, please see EMR for updated orders. Please see full H&P dictated by admitting physician Dr Camillo Flaming for same date of service.   Total care time 52 minutes  Shon Hale, MD

## 2023-06-07 NOTE — Assessment & Plan Note (Signed)
-  UA indicative of UTI -symptomatic with pelvic pain, dysuria, and urinary incontinence -Urine culture pending -Continue Rocephin -Blood cultures pending -No leukocytosis -Continue to monitor

## 2023-06-07 NOTE — Assessment & Plan Note (Signed)
Continue Statin 

## 2023-06-07 NOTE — Assessment & Plan Note (Signed)
-  T 100.7, HR 114, R 20 -Ua indicated UTI -CXR shows possible pneumonia, but not demonstrated on CTA -Urine culture pending -Blood culture pending -lactic acid 1.3 -Continue to monitor

## 2023-06-08 DIAGNOSIS — I480 Paroxysmal atrial fibrillation: Secondary | ICD-10-CM | POA: Diagnosis not present

## 2023-06-08 DIAGNOSIS — I10 Essential (primary) hypertension: Secondary | ICD-10-CM | POA: Diagnosis not present

## 2023-06-08 DIAGNOSIS — N3000 Acute cystitis without hematuria: Secondary | ICD-10-CM | POA: Diagnosis not present

## 2023-06-08 MED ORDER — FLUCONAZOLE 150 MG PO TABS: 150.0000 mg | ORAL_TABLET | Freq: Once | ORAL | 0 refills | Status: AC

## 2023-06-08 MED ORDER — SENNOSIDES-DOCUSATE SODIUM 8.6-50 MG PO TABS: 2.0000 | ORAL_TABLET | Freq: Every day | ORAL | 2 refills | Status: AC

## 2023-06-08 MED ORDER — POTASSIUM CHLORIDE ER 10 MEQ PO TBCR: 10.0000 meq | EXTENDED_RELEASE_TABLET | Freq: Every day | ORAL | 2 refills | Status: AC

## 2023-06-08 MED ORDER — CEPHALEXIN 500 MG PO CAPS: 500.0000 mg | ORAL_CAPSULE | Freq: Three times a day (TID) | ORAL | 0 refills | Status: AC

## 2023-06-08 MED ORDER — ATORVASTATIN CALCIUM 20 MG PO TABS: 20.0000 mg | ORAL_TABLET | Freq: Every day | ORAL | 5 refills | Status: AC

## 2023-06-08 MED ORDER — AMLODIPINE BESYLATE 10 MG PO TABS: 10.0000 mg | ORAL_TABLET | Freq: Every day | ORAL | 3 refills | Status: AC

## 2023-06-08 MED ORDER — LISINOPRIL-HYDROCHLOROTHIAZIDE 20-12.5 MG PO TABS: 1.0000 | ORAL_TABLET | Freq: Every day | ORAL | 3 refills | Status: AC

## 2023-06-08 MED ORDER — POLYETHYLENE GLYCOL 3350 17 G PO PACK: 17.0000 g | PACK | Freq: Every day | ORAL | 1 refills | Status: AC

## 2023-06-08 MED ORDER — APIXABAN 5 MG PO TABS: 5.0000 mg | ORAL_TABLET | Freq: Two times a day (BID) | ORAL | 5 refills | Status: AC

## 2023-06-08 NOTE — Discharge Summary (Signed)
Melissa Garner, is a 67 y.o. female  DOB 26-May-1956  MRN [id number].  Admission date:  06/06/2023  Admitting Physician  Shon Hale, MD  Discharge Date:  06/08/2023   Primary MD  Mirna Mires, MD  Recommendations for primary care physician for things to follow:   1)Avoid ibuprofen/Advil/Aleve/Motrin/Goody Powders/Naproxen/BC powders/Meloxicam/Diclofenac/Indomethacin and other Nonsteroidal anti-inflammatory medications as these will make you more likely to bleed and can cause stomach ulcers, can also cause Kidney problems.   2)Take potassium supplements While taking hydrochlorothiazide/HCTZ  Admission Diagnosis  UTI (urinary tract infection) [N39.0] Acute cystitis with hematuria [N30.01] Sepsis without acute organ dysfunction, due to unspecified organism [facility]) [A41.9]   Discharge Diagnosis  UTI (urinary tract infection) [N39.0] Acute cystitis with hematuria [N30.01] Sepsis without acute organ dysfunction, due to unspecified organism [facility]) [A41.9]    Principal Problem:   UTI (urinary tract infection) Active Problems:   HLD (hyperlipidemia)   HTN (hypertension)   SIRS (systemic inflammatory response syndrome) (HCC)   Hypokalemia   A-fib (HCC)      Past Medical History:  Diagnosis Date   Aortic stenosis    COPD (chronic obstructive pulmonary disease) (HCC)    Essential hypertension    Hyperlipidemia    Lumbar disc disease    Left leg pain   Obesity    Osteoarthritis    Parathyroid abnormality (HCC)    Paroxysmal atrial fibrillation (HCC)    Upper respiratory infection     Past Surgical History:  Procedure Laterality Date   ABDOMINAL HYSTERECTOMY  1989   Partial   CARPAL TUNNEL RELEASE Bilateral 2005   COLONOSCOPY N/A 01/14/2016   Procedure: COLONOSCOPY;  Surgeon: West Bali, MD;  Location: [facility];  Service: Endoscopy;  Laterality: N/A;  230    KNEE SURGERY Left 2008    total   PARATHYROIDECTOMY Left 12/02/2021   Procedure: LEFT PARATHYROIDECTOMY;  Surgeon: Darnell Level, MD;  Location: [facility];  Service: General;  Laterality: Left;     HPI  from the history and physical done on the day of admission:  HPI: Melissa Garner is a 67 y.o. female with medical history significant of COPD, hypertension, hyperlipidemia, obesity, parathyroid abnormality, A-fib, and more presents the ED with a chief complaint of cold chills.  Patient reports that she felt feverish and had tingling fingers.  She reports that this started on Sunday.  She had had pelvic pain and urinary incontinence as well.  Her urinary incontinence may have started 3 weeks ago.  All the symptoms are gradual in onset.  She went to a doctor and she was prescribed medication, but reports she does not remember what it was.  From the description it sounds like it could have been Diflucan.  Patient reports that it was not a Media planner.  She reports that she is not sure what her Tmax was at home.  She did take Tylenol and that helped her fever.  It did not help her pelvic pain.  Her pelvic pain radiates around to her back.  She was concerned it could be her kidneys.  She has noticed her urine has been darker.  She describes her pain as sharp.  She has had a decreased appetite as well.  Patient has no other complaints at this time.   Patient does not smoke, does not drink.  She is full code. Review of Systems: As mentioned in the history of present illness. All other systems reviewed and are negative.    Hospital Course:   Assessment and Plan: Brief Summary:-  67 y.o. female with medical history significant of COPD, hypertension, hyperlipidemia, obesity, parathyroid abnormality, A-fib admitted on 06/07/2023 with presumed sepsis from urinary source   A/p 1)SiRs secondary to urinary Source---  -Treated with IV Rocephin, --SIRs pathophysiology has resolved -Okay to discharge on Keflex cultures negative so  far -In retrospect sepsis has been ruled out   2)PAFib--- continue Eliquis for stroke prophylaxis -She is not on rate control agents   3)HTN--stable, restart lisinopril hydrochlorothiazide -Continue amlodipine  take with potassium while on HCTZ   4)HLD-continue atorvastatin   5)Constipation--laxatives as ordered  6) hypokalemia--- due to HCTZ use replaced, continue to take potassium while taking HCTZ  Discharge Condition: stable  Follow UP   Follow-up Information     Mirna Mires, MD. Call in 1 week(s).   Specialty: Family Medicine Contact information: [address] [place] [address] [phone]                  Diet and Activity recommendation:  As advised  Discharge Instructions  * Discharge Instructions     Call MD for:  difficulty breathing, headache or visual disturbances   Complete by: As directed    Call MD for:  persistant dizziness or light-headedness   Complete by: As directed    Call MD for:  persistant nausea and vomiting   Complete by: As directed    Call MD for:  temperature >100.4   Complete by: As directed    Diet - low sodium heart healthy   Complete by: As directed    Discharge instructions   Complete by: As directed    1)Avoid ibuprofen/Advil/Aleve/Motrin/Goody Powders/Naproxen/BC powders/Meloxicam/Diclofenac/Indomethacin and other Nonsteroidal anti-inflammatory medications as these will make you more likely to bleed and can cause stomach ulcers, can also cause Kidney problems.   2)Take potassium supplements While taking hydrochlorothiazide/HCTZ   Increase activity slowly   Complete by: As directed          Discharge Medications     Allergies as of 06/08/2023   No Known Allergies      Medication List     TAKE these medications    acetaminophen 500 MG tablet Commonly known as: TYLENOL Take 1,000 mg by mouth every 8 (eight) hours as needed for moderate pain.   amLODipine 10 MG tablet Commonly known as:  NORVASC Take 1 tablet (10 mg total) by mouth daily.   apixaban 5 MG Tabs tablet Commonly known as: ELIQUIS Take 1 tablet (5 mg total) by mouth 2 (two) times daily.   atorvastatin 20 MG tablet Commonly known as: LIPITOR Take 1 tablet (20 mg total) by mouth daily.   Breztri Aerosphere 160-9-4.8 MCG/ACT Aero Generic drug: Budeson-Glycopyrrol-Formoterol Inhale 2 puffs into the lungs in the morning and at bedtime.   cephALEXin 500 MG capsule Commonly known as: Keflex Take 1 capsule (500 mg total) by mouth 3 (three) times daily for 3 days.   Cholecalciferol 50 MCG (2000 UT) Caps Take 1 capsule (2,000 Units total)  by mouth daily with breakfast.   clobetasol cream 0.05 % Commonly known as: TEMOVATE Apply 1 application topically daily as needed (breakouts).   diclofenac sodium 1 % Gel Commonly known as: VOLTAREN Apply 1 application topically daily as needed (pain).   fluconazole 150 MG tablet Commonly known as: Diflucan Take 1 tablet (150 mg total) by mouth once for 1 dose. For yeast infection Start taking on: June 10, 2023   lisinopril-hydrochlorothiazide 20-12.5 MG tablet Commonly known as: ZESTORETIC Take 1 tablet by mouth daily.   ondansetron 4 MG disintegrating tablet Commonly known as: ZOFRAN-ODT Take 1 tablet (4 mg total) by mouth every 8 (eight) hours as needed for nausea or vomiting.   polyethylene glycol 17 g packet Commonly known as: MIRALAX / GLYCOLAX Take 17 g by mouth daily.   potassium chloride 10 MEQ tablet Commonly known as: KLOR-CON Take 1 tablet (10 mEq total) by mouth daily. Take While taking hydrochlorothiazide/HCTZ   senna-docusate 8.6-50 MG tablet Commonly known as: Senokot-S Take 2 tablets by mouth at bedtime.        Major procedures and Radiology Reports - PLEASE review detailed and final reports for all details, in brief -   CT Angio Chest PE W and/or Wo Contrast  Result Date: 06/06/2023 CLINICAL DATA:  Chills, fatigue, fever EXAM: CT  ANGIOGRAPHY CHEST WITH CONTRAST TECHNIQUE: Multidetector CT imaging of the chest was performed using the standard protocol during bolus administration of intravenous contrast. Multiplanar CT image reconstructions and MIPs were obtained to evaluate the vascular anatomy. RADIATION DOSE REDUCTION: This exam was performed according to the departmental dose-optimization program which includes automated exposure control, adjustment of the mA and/or kV according to patient size and/or use of iterative reconstruction technique. CONTRAST:  OMNIPAQUE IOHEXOL 350 MG/ML SOLN COMPARISON:  None Available. FINDINGS: Cardiovascular: Satisfactory opacification the bilateral pulmonary arteries to the segmental level. No evidence of pulmonary embolism. Although not tailored for evaluation of the thoracic aorta, there is no evidence thoracic aortic aneurysm or dissection. Mild atherosclerotic calcifications. Mild cardiomegaly.  No pericardial effusion. Mediastinum/Nodes: No suspicious mediastinal lymphadenopathy. Multiple bilateral thyroid nodules, left greater than right, measuring up to 3.4 cm on the left (series 4/image 9). These have been previously evaluated on thyroid ultrasound and biopsy. This has been evaluated on previous imaging. (ref: J Am Coll Radiol. 2015 Feb;12(2): 143-50). Lungs/Pleura: Lungs are essentially clear. No suspicious pulmonary nodules. No focal consolidation. Very mild mosaic attenuation in the bilateral lower lobes, favoring atelectasis over air trapping. No pleural effusion or pneumothorax. Upper Abdomen: Visualized upper abdomen is grossly unremarkable. Musculoskeletal: Degenerative changes of the visualized thoracolumbar spine. Review of the MIP images confirms the above findings. IMPRESSION: No evidence of pulmonary embolism. Mild cardiomegaly. No acute cardiopulmonary disease. Aortic Atherosclerosis (ICD10-I70.0). Electronically Signed   By: Charline Bills M.D.   On: 06/06/2023 23:24   DG  Chest 2 View  Result Date: 06/06/2023 CLINICAL DATA:  Cough EXAM: CHEST - 2 VIEW COMPARISON:  Chest x-ray dated September 11, 2020 FINDINGS: The heart size and mediastinal contours are within normal limits. New mild focal retrocardiac opacity. Both lungs are otherwise clear. The visualized skeletal structures are unremarkable. IMPRESSION: New mild focal retrocardiac opacity, which may represent atelectasis or infection. Recommend follow-up PA and lateral chest radiograph in 6-8 weeks to ensure resolution. Electronically Signed   By: Allegra Lai M.D.   On: 06/06/2023 18:20    Micro Results   Recent Results (from the past 240 hour(s))  SARS Coronavirus 2 by RT  PCR (hospital order, performed in [facility] hospital lab) *cepheid single result test* Anterior Nasal Swab     Status: None   Collection Time: 06/06/23  5:44 PM   Specimen: Anterior Nasal Swab  Result Value Ref Range Status   SARS Coronavirus 2 by RT PCR NEGATIVE NEGATIVE Final    Comment: (NOTE) SARS-CoV-2 target nucleic acids are NOT DETECTED.  The SARS-CoV-2 RNA is generally detectable in upper and lower respiratory specimens during the acute phase of infection. The lowest concentration of SARS-CoV-2 viral copies this assay can detect is 250 copies / mL. A negative result does not preclude SARS-CoV-2 infection and should not be used as the sole basis for treatment or other patient management decisions.  A negative result may occur with improper specimen collection / handling, submission of specimen other than nasopharyngeal swab, presence of viral mutation(s) within the areas targeted by this assay, and inadequate number of viral copies (<250 copies / mL). A negative result must be combined with clinical observations, patient history, and epidemiological information.  Fact Sheet for Patients:   RoadLapTop.co.za  Fact Sheet for Healthcare Providers: http://kim-miller.com/  This  test is not yet approved or  cleared by the [place] FDA and has been authorized for detection and/or diagnosis of SARS-CoV-2 by FDA under an Emergency Use Authorization (EUA).  This EUA will remain in effect (meaning this test can be used) for the duration of the COVID-19 declaration under Section 564(b)(1) of the Act, 21 U.S.C. section 360bbb-3(b)(1), unless the authorization is terminated or revoked sooner.  Performed at [facility], [address] [place], [place] [address]   Blood Culture (routine x 2)     Status: None (Preliminary result)   Collection Time: 06/06/23  7:42 PM   Specimen: BLOOD RIGHT ARM  Result Value Ref Range Status   Specimen Description BLOOD RIGHT ARM  Final   Special Requests   Final    BOTTLES DRAWN AEROBIC AND ANAEROBIC Blood Culture adequate volume   Culture   Final    NO GROWTH 2 DAYS Performed at [facility], [address] [place], [place] [address]    Report Status PENDING  Incomplete  Blood Culture (routine x 2)     Status: None (Preliminary result)   Collection Time: 06/06/23  7:55 PM   Specimen: BLOOD RIGHT ARM  Result Value Ref Range Status   Specimen Description BLOOD RIGHT ARM  Final   Special Requests   Final    BOTTLES DRAWN AEROBIC AND ANAEROBIC Blood Culture adequate volume   Culture   Final    NO GROWTH 2 DAYS Performed at [facility], [address], [place], [place] [address]    Report Status PENDING  Incomplete  Resp panel by RT-PCR (RSV, Flu A&B, Covid) Anterior Nasal Swab     Status: None   Collection Time: 06/06/23  9:30 PM   Specimen: Anterior Nasal Swab  Result Value Ref Range Status   SARS Coronavirus 2 by RT PCR NEGATIVE NEGATIVE Final    Comment: (NOTE) SARS-CoV-2 target nucleic acids are NOT DETECTED.  The SARS-CoV-2 RNA is generally detectable in upper respiratory specimens during the acute phase of infection. The lowest concentration of SARS-CoV-2 viral copies this assay can detect is 138 copies/mL. A  negative result does not preclude SARS-Cov-2 infection and should not be used as the sole basis for treatment or other patient management decisions. A negative result may occur with  improper specimen collection/handling, submission of specimen other than nasopharyngeal swab,  presence of viral mutation(s) within the areas targeted by this assay, and inadequate number of viral copies(<138 copies/mL). A negative result must be combined with clinical observations, patient history, and epidemiological information. The expected result is Negative.  Fact Sheet for Patients:  BloggerCourse.com  Fact Sheet for Healthcare Providers:  SeriousBroker.it  This test is no t yet approved or cleared by the [place] FDA and  has been authorized for detection and/or diagnosis of SARS-CoV-2 by FDA under an Emergency Use Authorization (EUA). This EUA will remain  in effect (meaning this test can be used) for the duration of the COVID-19 declaration under Section 564(b)(1) of the Act, 21 U.S.C.section 360bbb-3(b)(1), unless the authorization is terminated  or revoked sooner.       Influenza A by PCR NEGATIVE NEGATIVE Final   Influenza B by PCR NEGATIVE NEGATIVE Final    Comment: (NOTE) The Xpert Xpress SARS-CoV-2/FLU/RSV plus assay is intended as an aid in the diagnosis of influenza from Nasopharyngeal swab specimens and should not be used as a sole basis for treatment. Nasal washings and aspirates are unacceptable for Xpert Xpress SARS-CoV-2/FLU/RSV testing.  Fact Sheet for Patients: BloggerCourse.com  Fact Sheet for Healthcare Providers: SeriousBroker.it  This test is not yet approved or cleared by the [place] FDA and has been authorized for detection and/or diagnosis of SARS-CoV-2 by FDA under an Emergency Use Authorization (EUA). This EUA will remain in effect (meaning this test can  be used) for the duration of the COVID-19 declaration under Section 564(b)(1) of the Act, 21 U.S.C. section 360bbb-3(b)(1), unless the authorization is terminated or revoked.     Resp Syncytial Virus by PCR NEGATIVE NEGATIVE Final    Comment: (NOTE) Fact Sheet for Patients: BloggerCourse.com  Fact Sheet for Healthcare Providers: SeriousBroker.it  This test is not yet approved or cleared by the [place] FDA and has been authorized for detection and/or diagnosis of SARS-CoV-2 by FDA under an Emergency Use Authorization (EUA). This EUA will remain in effect (meaning this test can be used) for the duration of the COVID-19 declaration under Section 564(b)(1) of the Act, 21 U.S.C. section 360bbb-3(b)(1), unless the authorization is terminated or revoked.  Performed at [facility], [address] [place], [place] [address]   Urine Culture     Status: None   Collection Time: 06/06/23 10:30 PM   Specimen: Urine, Random  Result Value Ref Range Status   Specimen Description   Final    URINE, RANDOM Performed at [facility], [address] [place], [place] [address]    Special Requests   Final    NONE Reflexed from [id number] Performed at [facility], [address] [place], [place] [address]    Culture   Final    NO GROWTH Performed at [facility], [address], [place], [place] [address]    Report Status 06/08/2023 FINAL  Final    Today   Subjective    Asija Madera today has no new complaints No fever  Or chills  No nausea, vomiting, diarrhea           Patient has been seen and examined prior to discharge   Objective   Blood pressure 137/83, pulse 99, temperature 99.4 F (37.4 C), temperature source Oral, resp. rate 14, height 5\' 6"  (1.676 m), weight 136.1 kg, SpO2 96%.   Intake/Output Summary (Last 24 hours) at 06/08/2023 1546 Last data filed at 06/08/2023 1300 Gross per 24 hour  Intake 1260 ml   Output --  Net 1260 ml    Exam Gen:- Awake Alert, no acute distress  HEENT:- [place].AT, No sclera icterus Neck-Supple Neck,No JVD,.  Lungs-  CTAB , good air movement bilaterally CV- S1, S2 normal, regular Abd-  +ve B.Sounds, Abd Soft, No tenderness,  No CVA Tenderness  Extremity/Skin:- No  edema,   good pulses Psych-affect is appropriate, oriented x3 Neuro-no new focal deficits, no tremors    Data Review   CBC w Diff:  Lab Results  Component Value Date   WBC 7.3 06/07/2023   HGB 10.4 (L) 06/07/2023   HCT 34.0 (L) 06/07/2023   PLT 213 06/07/2023   LYMPHOPCT 14 06/07/2023   MONOPCT 18 06/07/2023   EOSPCT 0 06/07/2023   BASOPCT 0 06/07/2023    CMP:  Lab Results  Component Value Date   NA 138 06/07/2023   K 3.5 06/07/2023   CL 101 06/07/2023   CO2 29 06/07/2023   BUN 11 06/07/2023   CREATININE 0.79 06/07/2023   PROT 6.9 06/07/2023   ALBUMIN 2.9 (L) 06/07/2023   BILITOT 1.2 06/07/2023   ALKPHOS 69 06/07/2023   AST 25 06/07/2023   ALT 22 06/07/2023  .  Total Discharge time is about 33 minutes  Shon Hale M.D on 06/08/2023 at 3:46 PM  Go to [email or web address] -  for contact info  [facility] - Office  [phone]

## 2023-06-08 NOTE — Plan of Care (Signed)

## 2023-06-08 NOTE — Progress Notes (Signed)
Patient is resting in bed at this time. No complaints of pain or discomfort during this shift. Patient is independent of ADL's. Plan of care ongoing.

## 2023-06-08 NOTE — Discharge Instructions (Signed)
1)Avoid ibuprofen/Advil/Aleve/Motrin/Goody Powders/Naproxen/BC powders/Meloxicam/Diclofenac/Indomethacin and other Nonsteroidal anti-inflammatory medications as these will make you more likely to bleed and can cause stomach ulcers, can also cause Kidney problems.   2)Take potassium supplements While taking hydrochlorothiazide/HCTZ

## 2023-06-08 NOTE — Care Management Important Message (Signed)
Important Message  Patient Details  Name: Melissa Garner MRN: [id number] Date of Birth: 02/17/1956   Medicare Important Message Given:  N/A - LOS <3 / Initial given by admissions     Corey Harold 06/08/2023, 10:06 AM

## 2023-06-13 DIAGNOSIS — E876 Hypokalemia: Secondary | ICD-10-CM | POA: Diagnosis not present

## 2023-06-13 DIAGNOSIS — I480 Paroxysmal atrial fibrillation: Secondary | ICD-10-CM | POA: Diagnosis not present

## 2023-06-13 DIAGNOSIS — I10 Essential (primary) hypertension: Secondary | ICD-10-CM | POA: Diagnosis not present

## 2023-06-13 DIAGNOSIS — E782 Mixed hyperlipidemia: Secondary | ICD-10-CM | POA: Diagnosis not present

## 2023-06-13 DIAGNOSIS — N390 Urinary tract infection, site not specified: Secondary | ICD-10-CM | POA: Diagnosis not present

## 2023-07-19 ENCOUNTER — Telehealth: Payer: Self-pay | Admitting: "Endocrinology

## 2023-07-19 DIAGNOSIS — E042 Nontoxic multinodular goiter: Secondary | ICD-10-CM

## 2023-07-19 NOTE — Telephone Encounter (Signed)
I will call patient if you can order the ultrasound  Thanks

## 2023-07-19 NOTE — Telephone Encounter (Signed)
Ultrasound of thyroid ordered

## 2023-07-19 NOTE — Telephone Encounter (Signed)
Patient called and said that she is having pain and muscle spasms in her neck and is wondering if she should have an ultrasound before her next appt in November

## 2023-07-24 ENCOUNTER — Ambulatory Visit (HOSPITAL_COMMUNITY)
Admission: RE | Admit: 2023-07-24 | Discharge: 2023-07-24 | Disposition: A | Discharge status: Home / Self Care | Payer: Medicare Other | Source: Ambulatory Visit | Attending: "Endocrinology | Admitting: "Endocrinology

## 2023-07-24 DIAGNOSIS — E042 Nontoxic multinodular goiter: Secondary | ICD-10-CM | POA: Diagnosis not present

## 2023-08-19 DIAGNOSIS — Z1231 Encounter for screening mammogram for malignant neoplasm of breast: Secondary | ICD-10-CM | POA: Diagnosis not present

## 2023-09-10 ENCOUNTER — Emergency Department (HOSPITAL_COMMUNITY)
Admission: EM | Admit: 2023-09-10 | Discharge: 2023-09-10 | Disposition: A | Discharge status: Home / Self Care | Payer: Medicare Other | Attending: Emergency Medicine | Admitting: Emergency Medicine

## 2023-09-10 ENCOUNTER — Emergency Department (HOSPITAL_COMMUNITY): Payer: Medicare Other

## 2023-09-10 ENCOUNTER — Encounter (HOSPITAL_COMMUNITY): Payer: Self-pay

## 2023-09-10 ENCOUNTER — Other Ambulatory Visit: Payer: Self-pay

## 2023-09-10 DIAGNOSIS — Z743 Need for continuous supervision: Secondary | ICD-10-CM | POA: Diagnosis not present

## 2023-09-10 DIAGNOSIS — R1032 Left lower quadrant pain: Secondary | ICD-10-CM | POA: Insufficient documentation

## 2023-09-10 DIAGNOSIS — J449 Chronic obstructive pulmonary disease, unspecified: Secondary | ICD-10-CM | POA: Insufficient documentation

## 2023-09-10 DIAGNOSIS — R109 Unspecified abdominal pain: Secondary | ICD-10-CM | POA: Diagnosis not present

## 2023-09-10 DIAGNOSIS — Z7901 Long term (current) use of anticoagulants: Secondary | ICD-10-CM | POA: Insufficient documentation

## 2023-09-10 DIAGNOSIS — R6889 Other general symptoms and signs: Secondary | ICD-10-CM | POA: Diagnosis not present

## 2023-09-10 DIAGNOSIS — Z7951 Long term (current) use of inhaled steroids: Secondary | ICD-10-CM | POA: Insufficient documentation

## 2023-09-10 DIAGNOSIS — K439 Ventral hernia without obstruction or gangrene: Secondary | ICD-10-CM | POA: Diagnosis not present

## 2023-09-10 DIAGNOSIS — R1030 Lower abdominal pain, unspecified: Secondary | ICD-10-CM | POA: Diagnosis not present

## 2023-09-10 LAB — URINALYSIS, ROUTINE W REFLEX MICROSCOPIC
Bilirubin Urine: NEGATIVE
Glucose, UA: NEGATIVE mg/dL
Hgb urine dipstick: NEGATIVE
Ketones, ur: NEGATIVE mg/dL
Leukocytes,Ua: NEGATIVE
Nitrite: NEGATIVE
Protein, ur: NEGATIVE mg/dL
Specific Gravity, Urine: >1.046 — ABNORMAL HIGH (ref 1.005–1.030)
pH: 5 (ref 5.0–8.0)

## 2023-09-10 LAB — CBC WITH DIFFERENTIAL/PLATELET
Abs Immature Granulocytes: 0.02 10*3/uL (ref 0.00–0.07)
Basophils Absolute: 0 10*3/uL (ref 0.0–0.1)
Basophils Relative: 1 %
Eosinophils Absolute: 0.1 10*3/uL (ref 0.0–0.5)
Eosinophils Relative: 1 %
HCT: 39.4 % (ref 36.0–46.0)
Hemoglobin: 12.1 g/dL (ref 12.0–15.0)
Immature Granulocytes: 1 %
Lymphocytes Relative: 27 %
Lymphs Abs: 1.2 10*3/uL (ref 0.7–4.0)
MCH: 25.6 pg — ABNORMAL LOW (ref 26.0–34.0)
MCHC: 30.7 g/dL (ref 30.0–36.0)
MCV: 83.3 fL (ref 80.0–100.0)
Monocytes Absolute: 0.4 10*3/uL (ref 0.1–1.0)
Monocytes Relative: 8 %
Neutro Abs: 2.8 10*3/uL (ref 1.7–7.7)
Neutrophils Relative %: 62 %
Platelets: 214 10*3/uL (ref 150–400)
RBC: 4.73 MIL/uL (ref 3.87–5.11)
RDW: 14.5 % (ref 11.5–15.5)
WBC: 4.4 10*3/uL (ref 4.0–10.5)
nRBC: 0 % (ref 0.0–0.2)

## 2023-09-10 LAB — COMPREHENSIVE METABOLIC PANEL
ALT: 15 U/L (ref 0–44)
AST: 24 U/L (ref 15–41)
Albumin: 3.7 g/dL (ref 3.5–5.0)
Alkaline Phosphatase: 77 U/L (ref 38–126)
Anion gap: 11 (ref 5–15)
BUN: 10 mg/dL (ref 8–23)
CO2: 27 mmol/L (ref 22–32)
Calcium: 8.9 mg/dL (ref 8.9–10.3)
Chloride: 97 mmol/L — ABNORMAL LOW (ref 98–111)
Creatinine, Ser: 0.81 mg/dL (ref 0.44–1.00)
GFR, Estimated: >60 mL/min (ref >60)
Glucose, Bld: 126 mg/dL — ABNORMAL HIGH (ref 70–99)
Potassium: 3.9 mmol/L (ref 3.5–5.1)
Sodium: 135 mmol/L (ref 135–145)
Total Bilirubin: 1.2 mg/dL — ABNORMAL HIGH (ref <1.2)
Total Protein: 8 g/dL (ref 6.5–8.1)

## 2023-09-10 MED ORDER — IOHEXOL 300 MG/ML  SOLN
100.0000 mL | Freq: Once | INTRAMUSCULAR | Status: AC | PRN
  Administered 2023-09-10: 100 mL via INTRAVENOUS

## 2023-09-10 MED ORDER — NAPROXEN 500 MG PO TABS: 500.0000 mg | ORAL_TABLET | Freq: Two times a day (BID) | ORAL | 0 refills | Status: AC

## 2023-09-10 MED ORDER — ONDANSETRON HCL 4 MG/2ML IJ SOLN
4.0000 mg | Freq: Once | INTRAMUSCULAR | Status: AC
  Administered 2023-09-10: 4 mg via INTRAVENOUS
  Filled 2023-09-10: qty 2

## 2023-09-10 MED ORDER — FENTANYL CITRATE PF 50 MCG/ML IJ SOSY
50.0000 ug | PREFILLED_SYRINGE | Freq: Once | INTRAMUSCULAR | Status: AC
  Administered 2023-09-10: 50 ug via INTRAVENOUS
  Filled 2023-09-10: qty 1

## 2023-09-10 NOTE — ED Triage Notes (Signed)
Pt arrived via [facility] from home with c/o L lower abd pain since yesterday , hasn't been able to eat today, LBM x 2 days ago  takes miralax without any relief

## 2023-09-10 NOTE — ED Provider Notes (Signed)
[place] EMERGENCY DEPARTMENT AT [facility] Provider Note   CSN: [id number] Arrival date & time: 09/10/23  1528     History  Chief Complaint  Patient presents with   Abdominal Pain    Melissa Garner is a 67 y.o. female.   Abdominal Pain  This patient is a 67 year old female, she has a history of being admitted to the hospital approximately 3 months ago for sepsis and UTI, she has a history of aortic stenosis, COPD, paroxysmal atrial fibrillation and is currently on Eliquis with her last dose being last night.  She presents to the hospital today stating that yesterday she had the onset of left lower quadrant abdominal pain, sharp and stabbing, intermittent, she states that it is a deep pain that she cannot make worse by palpation, it is not making her nauseated, she is not vomiting, she is not diaphoretic, she has not noticed dysuria hematuria or diarrhea, she actually took a dose of MiraLAX earlier today because she thought that 2 days without a bowel movement might of caused her symptoms.  She has a prior history of a colonoscopy most recently in 2017, she had a partial hysterectomy in 1989 but no other abdominal surgery.    Home Medications Prior to Admission medications   Medication Sig Start Date End Date Taking? Authorizing Provider  naproxen (NAPROSYN) 500 MG tablet Take 1 tablet (500 mg total) by mouth 2 (two) times daily with a meal. 09/10/23  Yes Eber Hong, MD  acetaminophen (TYLENOL) 500 MG tablet Take 1,000 mg by mouth every 8 (eight) hours as needed for moderate pain.    [provider]  amLODipine (NORVASC) 10 MG tablet Take 1 tablet (10 mg total) by mouth daily. 06/08/23   Shon Hale, MD  apixaban (ELIQUIS) 5 MG TABS tablet Take 1 tablet (5 mg total) by mouth 2 (two) times daily. 06/08/23   Shon Hale, MD  atorvastatin (LIPITOR) 20 MG tablet Take 1 tablet (20 mg total) by mouth daily. 06/08/23   Shon Hale, MD   Budeson-Glycopyrrol-Formoterol (BREZTRI AEROSPHERE) 160-9-4.8 MCG/ACT AERO Inhale 2 puffs into the lungs in the morning and at bedtime.    [provider]  Cholecalciferol 50 MCG (2000 UT) CAPS Take 1 capsule (2,000 Units total) by mouth daily with breakfast. 02/17/21   Nida, Denman George, MD  clobetasol cream (TEMOVATE) 0.05 % Apply 1 application topically daily as needed (breakouts). 07/29/20   [provider]  diclofenac sodium (VOLTAREN) 1 % GEL Apply 1 application topically daily as needed (pain). 01/22/19   [provider]  lisinopril-hydrochlorothiazide (ZESTORETIC) 20-12.5 MG tablet Take 1 tablet by mouth daily. 06/08/23   Shon Hale, MD  ondansetron (ZOFRAN-ODT) 4 MG disintegrating tablet Take 1 tablet (4 mg total) by mouth every 8 (eight) hours as needed for nausea or vomiting. 10/29/21   Particia Nearing, PA-C  polyethylene glycol (MIRALAX / GLYCOLAX) 17 g packet Take 17 g by mouth daily. 06/08/23   Shon Hale, MD  potassium chloride (KLOR-CON) 10 MEQ tablet Take 1 tablet (10 mEq total) by mouth daily. Take While taking hydrochlorothiazide/HCTZ 06/08/23   Shon Hale, MD  senna-docusate (SENOKOT-S) 8.6-50 MG tablet Take 2 tablets by mouth at bedtime. 06/08/23   Shon Hale, MD      Allergies    Patient has no known allergies.    Review of Systems   Review of Systems  Gastrointestinal:  Positive for abdominal pain.  All other systems reviewed and are negative.   Physical  Exam Updated Vital Signs BP 126/66   Pulse (!) 58   Temp 98 F (36.7 C) (Oral)   Resp 19   SpO2 96%  Physical Exam Vitals and nursing note reviewed.  Constitutional:      General: She is not in acute distress.    Appearance: She is well-developed.  HENT:     Head: Normocephalic and atraumatic.     Mouth/Throat:     Pharynx: No oropharyngeal exudate.  Eyes:     General: No scleral icterus.       Right eye: No discharge.        Left eye: No discharge.      Conjunctiva/sclera: Conjunctivae normal.     Pupils: Pupils are equal, round, and reactive to light.  Neck:     Thyroid: No thyromegaly.     Vascular: No JVD.  Cardiovascular:     Rate and Rhythm: Normal rate and regular rhythm.     Heart sounds: Normal heart sounds. No murmur heard.    No friction rub. No gallop.  Pulmonary:     Effort: Pulmonary effort is normal. No respiratory distress.     Breath sounds: Normal breath sounds. No wheezing or rales.  Abdominal:     General: Bowel sounds are normal. There is no distension.     Palpations: Abdomen is soft. There is no mass.     Tenderness: There is no abdominal tenderness.  Musculoskeletal:        General: No tenderness. Normal range of motion.     Cervical back: Normal range of motion and neck supple.  Lymphadenopathy:     Cervical: No cervical adenopathy.  Skin:    General: Skin is warm and dry.     Findings: No erythema or rash.  Neurological:     Mental Status: She is alert.     Coordination: Coordination normal.  Psychiatric:        Behavior: Behavior normal.     ED Results / Procedures / Treatments   Labs (all labs ordered are listed, but only abnormal results are displayed) Labs Reviewed  CBC WITH DIFFERENTIAL/PLATELET - Abnormal; Notable for the following components:      Result Value   MCH 25.6 (*)    All other components within normal limits  COMPREHENSIVE METABOLIC PANEL - Abnormal; Notable for the following components:   Chloride 97 (*)    Glucose, Bld 126 (*)    Total Bilirubin 1.2 (*)    All other components within normal limits  URINALYSIS, ROUTINE W REFLEX MICROSCOPIC - Abnormal; Notable for the following components:   Specific Gravity, Urine >1.046 (*)    All other components within normal limits  URINE CULTURE    EKG None  Radiology CT ABDOMEN PELVIS W CONTRAST  Result Date: 09/10/2023 CLINICAL DATA:  Lower abdominal pain for 1 day. EXAM: CT ABDOMEN AND PELVIS WITH CONTRAST TECHNIQUE:  Multidetector CT imaging of the abdomen and pelvis was performed using the standard protocol following bolus administration of intravenous contrast. RADIATION DOSE REDUCTION: This exam was performed according to the departmental dose-optimization program which includes automated exposure control, adjustment of the mA and/or kV according to patient size and/or use of iterative reconstruction technique. CONTRAST:  OMNIPAQUE IOHEXOL 300 MG/ML  SOLN COMPARISON:  CT abdomen pelvis dated 01/12/2013. FINDINGS: Lower chest: Cardiomegaly. Hepatobiliary: No focal liver abnormality is seen. No gallstones, gallbladder wall thickening, or biliary dilatation. Pancreas: Unremarkable. No pancreatic ductal dilatation or surrounding inflammatory changes. Spleen: Normal in size  without focal abnormality. Adrenals/Urinary Tract: Adrenal glands are unremarkable. An enhancing lesion in the posterior interpolar left kidney measures 0.8 cm (series 2, image 32) and appears similar in size and morphology to 01/15/2013. No renal calculi or hydronephrosis on either side. Bladder is unremarkable. Stomach/Bowel: Stomach is within normal limits. Appendix appears normal. No evidence of bowel wall thickening, distention, or inflammatory changes. Vascular/Lymphatic: Aortic atherosclerosis. No enlarged abdominal or pelvic lymph nodes. Reproductive: Status post hysterectomy. No adnexal masses. Other: A fat containing ventral abdominal wall hernia in the lower midline appears slightly increased in size since 01/12/2013. No significant fat stranding or edema within the hernia sac to suggest ischemia. The hernia sac measures approximately 11 x 7 x 10 mm in size. Musculoskeletal: There is 5 mm anterolisthesis of L4 on L5. Degenerative changes are seen in the spine. IMPRESSION: 1. Fat containing ventral abdominal wall hernia in the lower midline appears slightly increased in size since 01/12/2013. No evidence of ischemia. 2. An enhancing lesion in  the posterior interpolar left kidney appears similar in size and morphology to 01/15/2013. Given the stability for greater than 10 years this finding is unlikely of any clinical significance and no further imaging workup is recommended. Aortic Atherosclerosis (ICD10-I70.0). Electronically Signed   By: Romona Curls M.D.   On: 09/10/2023 17:33    Procedures Procedures    Medications Ordered in ED Medications  fentaNYL (SUBLIMAZE) injection 50 mcg (50 mcg Intravenous Given 09/10/23 1549)  ondansetron (ZOFRAN) injection 4 mg (4 mg Intravenous Given 09/10/23 1548)  iohexol (OMNIPAQUE) 300 MG/ML solution 100 mL (100 mLs Intravenous Contrast Given 09/10/23 1615)    ED Course/ Medical Decision Making/ A&P                                 Medical Decision Making Amount and/or Complexity of Data Reviewed Labs: ordered. Radiology: ordered.  Risk Prescription drug management.    This patient presents to the ED for concern of abdominal pain, this involves an extensive number of treatment options, and is a complaint that carries with it a high risk of complications and morbidity.  The differential diagnosis includes kidney stone, hernia, diverticulitis, abscess, perforated appendicitis, gallbladder disease, stomach ulcer, AAA   Co morbidities that complicate the patient evaluation  The patient is morbidly obese   Additional history obtained:  Additional history obtained from medical record External records from outside source obtained and reviewed including recent admission to the hospital for sepsis   Lab Tests:  I Ordered, and personally interpreted labs.  The pertinent results include: CBC metabolic panel and urinalysis without acute findings of concern   Imaging Studies ordered:  I ordered imaging studies including CT scan of the abdomen and pelvis I independently visualized and interpreted imaging which showed no acute surgical findings though it did show that she had a  slightly enlarged ventral hernia containing fat only I agree with the radiologist interpretation   Cardiac Monitoring: / EKG:  The patient was maintained on a cardiac monitor.  I personally viewed and interpreted the cardiac monitored which showed an underlying rhythm of: Normal sinus rhythm   Problem List / ED Course / Critical interventions / Medication management  The patient appears well was given medications to help with pain, symptoms improved and she was updated on all of her results I ordered medication including fentanyl for pain Reevaluation of the patient after these medicines showed that the patient improved I  have reviewed the patients home medicines and have made adjustments as needed   Social Determinants of Health:  Obesity   Test / Admission - Considered:  Considered admission but the patient has a reassuring exam history and findings on her workup, she is stable for discharge  I have discussed with the patient at the bedside the results, and the meaning of these results.  They have had opportunity to ask questions,  expressed their understanding to the need for follow-up with primary care physician          Final Clinical Impression(s) / ED Diagnoses Final diagnoses:  Left lower quadrant abdominal pain    Rx / DC Orders ED Discharge Orders          Ordered    naproxen (NAPROSYN) 500 MG tablet  2 times daily with meals        09/10/23 1819              Eber Hong, MD 09/10/23 1821

## 2023-09-10 NOTE — Discharge Instructions (Signed)
Thankfully your testing did not show any signs of significant abnormal problems, it does show that you have a small hernia around your bellybutton that only contains some fat, it does not contain any bowel which means that you do not need any emergent surgery for this.  All of your inside organs appear normal, your blood work was normal, your urine sample was normal, your vital signs have been normal.  This may be related to some muscle spasm but thankfully there does not appear to be anything surgical.  You may take Naprosyn twice a day as needed for pain, Tylenol as needed for pain, ER for worsening symptoms otherwise see your doctor within 3 days for recheck

## 2023-09-11 ENCOUNTER — Ambulatory Visit: Payer: Medicare Other | Admitting: "Endocrinology

## 2023-09-11 LAB — URINE CULTURE: Culture: NO GROWTH

## 2023-09-12 ENCOUNTER — Other Ambulatory Visit: Payer: Self-pay | Admitting: *Deleted

## 2023-09-12 ENCOUNTER — Telehealth: Payer: Self-pay | Admitting: "Endocrinology

## 2023-09-12 DIAGNOSIS — E559 Vitamin D deficiency, unspecified: Secondary | ICD-10-CM

## 2023-09-12 DIAGNOSIS — E042 Nontoxic multinodular goiter: Secondary | ICD-10-CM

## 2023-09-12 NOTE — Telephone Encounter (Signed)
Please update labs

## 2023-09-12 NOTE — Telephone Encounter (Signed)
Labs have been updated . 

## 2023-09-19 DIAGNOSIS — E042 Nontoxic multinodular goiter: Secondary | ICD-10-CM | POA: Diagnosis not present

## 2023-09-19 DIAGNOSIS — E559 Vitamin D deficiency, unspecified: Secondary | ICD-10-CM | POA: Diagnosis not present

## 2023-09-20 LAB — PTH, INTACT AND CALCIUM
Calcium: 10 mg/dL (ref 8.7–10.3)
PTH: 41 pg/mL (ref 15–65)

## 2023-09-20 LAB — TSH: TSH: 1.17 u[IU]/mL (ref 0.450–4.500)

## 2023-09-20 LAB — T4, FREE: Free T4: 1.06 ng/dL (ref 0.82–1.77)

## 2023-09-21 ENCOUNTER — Encounter: Payer: Self-pay | Admitting: "Endocrinology

## 2023-09-21 ENCOUNTER — Ambulatory Visit (INDEPENDENT_AMBULATORY_CARE_PROVIDER_SITE_OTHER): Payer: Medicare Other | Admitting: "Endocrinology

## 2023-09-21 VITALS — BP 134/72 | HR 68 | Ht 66.0 in | Wt 309.0 lb

## 2023-09-21 DIAGNOSIS — E042 Nontoxic multinodular goiter: Secondary | ICD-10-CM

## 2023-09-21 DIAGNOSIS — E559 Vitamin D deficiency, unspecified: Secondary | ICD-10-CM

## 2023-09-21 DIAGNOSIS — E210 Primary hyperparathyroidism: Secondary | ICD-10-CM

## 2023-09-21 NOTE — Progress Notes (Signed)
09/21/2023    Endocrinology follow-up note      Melissa Garner is a 67 y.o.-year-old female, referred by her  PCP  Mirna Mires, MD , for evaluation for hypercalcemia/hyperparathyroidism.  She is status post parathyroid surgery. She is returning for follow-up with repeat labs.  Past Medical History:  Diagnosis Date   Aortic stenosis    COPD (chronic obstructive pulmonary disease) (HCC)    Essential hypertension    Hyperlipidemia    Lumbar disc disease    Left leg pain   Obesity    Osteoarthritis    Parathyroid abnormality (HCC)    Paroxysmal atrial fibrillation (HCC)    Upper respiratory infection     Past Surgical History:  Procedure Laterality Date   ABDOMINAL HYSTERECTOMY  1989   Partial   CARPAL TUNNEL RELEASE Bilateral 2005   COLONOSCOPY N/A 01/14/2016   Procedure: COLONOSCOPY;  Surgeon: West Bali, MD;  Location: [facility];  Service: Endoscopy;  Laterality: N/A;  230    KNEE SURGERY Left 2008   total   PARATHYROIDECTOMY Left 12/02/2021   Procedure: LEFT PARATHYROIDECTOMY;  Surgeon: Darnell Level, MD;  Location: [facility];  Service: General;  Laterality: Left;    Social History   Tobacco Use   Smoking status: Never   Smokeless tobacco: Never  Vaping Use   Vaping status: Never Used  Substance Use Topics   Alcohol use: No    Alcohol/week: 0.0 standard drinks of alcohol   Drug use: No    Family History  Problem Relation Age of Onset   Heart failure Mother    Alcoholism Father    Cancer Father    COPD Sister    Hypertension Sister    Arthritis Sister    Seizures Sister    Hypertension Brother    Arthritis Brother    Hypertension Brother    Arthritis Brother    Hypertension Brother    Arthritis Brother    Hypertension Sister    Hypertension Sister    COPD Sister    Hypertension Daughter    Hypertension Son    Crohn's disease Son    Colon cancer Neg Hx     Outpatient Encounter Medications as of 09/21/2023  Medication Sig    acetaminophen (TYLENOL) 500 MG tablet Take 1,000 mg by mouth every 8 (eight) hours as needed for moderate pain.   amLODipine (NORVASC) 10 MG tablet Take 1 tablet (10 mg total) by mouth daily.   apixaban (ELIQUIS) 5 MG TABS tablet Take 1 tablet (5 mg total) by mouth 2 (two) times daily.   atorvastatin (LIPITOR) 20 MG tablet Take 1 tablet (20 mg total) by mouth daily.   Budeson-Glycopyrrol-Formoterol (BREZTRI AEROSPHERE) 160-9-4.8 MCG/ACT AERO Inhale 2 puffs into the lungs in the morning and at bedtime.   Cholecalciferol 50 MCG (2000 UT) CAPS Take 1 capsule (2,000 Units total) by mouth daily with breakfast.   clobetasol cream (TEMOVATE) 0.05 % Apply 1 application topically daily as needed (breakouts).   diclofenac sodium (VOLTAREN) 1 % GEL Apply 1 application topically daily as needed (pain).   lisinopril-hydrochlorothiazide (ZESTORETIC) 20-12.5 MG tablet Take 1 tablet by mouth daily.   naproxen (NAPROSYN) 500 MG tablet Take 1 tablet (500 mg total) by mouth 2 (two) times daily with a meal.   ondansetron (ZOFRAN-ODT) 4 MG disintegrating tablet Take 1 tablet (4 mg total) by mouth every 8 (eight) hours as needed for nausea or vomiting.   polyethylene glycol (MIRALAX / GLYCOLAX) 17 g packet Take  17 g by mouth daily.   potassium chloride (KLOR-CON) 10 MEQ tablet Take 1 tablet (10 mEq total) by mouth daily. Take While taking hydrochlorothiazide/HCTZ   senna-docusate (SENOKOT-S) 8.6-50 MG tablet Take 2 tablets by mouth at bedtime.   No facility-administered encounter medications on file as of 09/21/2023.    No Known Allergies   HPI  Melissa Garner is returning for follow-up after she was seen in consultation for hypercalcemia associated with elevated PTH level.  Her presentation was consistent with primary hyperparathyroidism. After appropriate work confirming hypercalcemia as a result of PHPT, she was referred for surgery. She underwent surgery by Dr. Darnell Level on 12/03/21. Surgical out come: Left  superior adenoma. Postsurgical PTH has been fluctuating, however most recently stabilizing at 41 associated with calcium of 10.     She is status post treatment for vitamin D deficiency currently replete at 42.7. Patient has no previously known  pituitary, adrenal dysfunctions; no family history of such dysfunctions. -She has normal renal function.  She did not have any recent bone density.  No prior history of fragility fractures or falls. No history of  kidney stones.  No history of CKD.   she is not on HCTZ or other thiazide therapy. she is not on calcium supplements,  she eats dairy and green, leafy, vegetables on average amounts.  she does not have a family history of hypercalcemia, pituitary tumors, thyroid cancer, or osteoporosis.  Her bone density in October 2021 was normal.  During recent trip to ER she underwent CT scan of head and neck which revealed multiple thyroid nodules.  She underwent thyroid ultrasound which confirms multiple nodules including 2.5 cm suspicious nodule in the left lobe.  Biopsy of the suspicious nodule prior to her recent visit was benign.    -She does not have any new complaints since last visit.   ROS: Limited as above.  PE: BP 134/72   Pulse 68   Ht 5\' 6"  (1.676 m)   Wt (!) 309 lb (140.2 kg)   BMI 49.87 kg/m , Body mass index is 49.87 kg/m. Wt Readings from Last 3 Encounters:  09/21/23 (!) 309 lb (140.2 kg)  06/06/23 300 lb (136.1 kg)  09/09/22 (!) 309 lb 12.8 oz (140.5 kg)     CMP ( most recent) CMP     Component Value Date/Time   NA 135 09/10/2023 1545   K 3.9 09/10/2023 1545   CL 97 (L) 09/10/2023 1545   CO2 27 09/10/2023 1545   GLUCOSE 126 (H) 09/10/2023 1545   BUN 10 09/10/2023 1545   CREATININE 0.81 09/10/2023 1545   CALCIUM 10.0 09/19/2023 1454   PROT 8.0 09/10/2023 1545   ALBUMIN 3.7 09/10/2023 1545   AST 24 09/10/2023 1545   ALT 15 09/10/2023 1545   ALKPHOS 77 09/10/2023 1545   BILITOT 1.2 (H) 09/10/2023 1545    GFRNONAA >60 09/10/2023 1545   GFRAA >60 07/04/2019 0905     Lab Results  Component Value Date   TSH 1.170 09/19/2023   TSH 1.409 06/07/2023   TSH 1.620 08/23/2022   TSH 2.760 02/16/2022   TSH 1.250 06/16/2021   TSH 1.730 02/15/2021   TSH 1.45 08/05/2020   TSH 1.38 05/20/2019   TSH 1.716 07/11/2013   FREET4 1.06 09/19/2023   FREET4 1.13 08/23/2022   FREET4 1.02 02/16/2022   FREET4 1.04 06/16/2021   FREET4 1.00 02/15/2021   FREET4 1.0 08/05/2020   FREET4 0.94 07/11/2013    May 20, 2019 PTH 119,  calcium 10.7  Assessment: 1. Hypercalcemia / Hyperparathyroidism- resolved after surgery 2.  Multinodular goiter-benign FNA. 3.  Vitamin D deficiency  Plan: I discussed her recent labs, previous imaging studies and surgical outcomes.  She returns with improved PTH at 43 associated with normal calcium at 10.    - Left superior parathyroid adenoma was removed during her previous surgery. She will not need further treatment at this time.    Her bone density is normal.  She will be kept on close follow-up with repeat labs in 12 months. - No apparent complications from hypercalcemia/hyperparathyroidism: no history of  nephrolithiasis,  osteoporosis, fragility fractures. No abdominal pain, no major mood disorders, no bone pain.   She will be continued on vitamin D3 2000 units daily.     She is advised to avoid any over-the-counter calcium supplements.   Regarding her multinodular goiter, she is status post biopsy of left lobe nodule with benign outcomes in October 2020.  Her repeat thyroid ultrasound documents multinodular goiter with no concerning features at this time.  She is advised to call clinic for compressive symptoms.  She will have surveillance repeat thyroid/neck ultrasound before next visit in a year.   If she develops compressive symptoms, she will be considered for thyroidectomy.  She is advised to maintain close follow-up with her PCP.    I spent  26  minutes in the  care of the patient today including review of labs from Thyroid Function, CMP, and other relevant labs ; imaging/biopsy records (current and previous including abstractions from other facilities); face-to-face time discussing  her lab results and symptoms, medications doses, her options of short and long term treatment based on the latest standards of care / guidelines;   and documenting the encounter.  Artis Guilliams  participated in the discussions, expressed understanding, and voiced agreement with the above plans.  All questions were answered to her satisfaction. she is encouraged to contact clinic should she have any questions or concerns prior to her return visit.    - Return in about 1 year (around 09/20/2024) for Fasting Labs  in AM B4 8, Thyroid / Neck Ultrasound.   Marquis Lunch, MD [facility] [address] [place], [place] [address] Phone: [phone]  Fax: [phone]    This note was partially dictated with voice recognition software. Similar sounding words can be transcribed inadequately or may not  be corrected upon review.  09/21/2023, 1:33 PM

## 2023-09-29 ENCOUNTER — Ambulatory Visit (HOSPITAL_COMMUNITY): Payer: Medicare Other | Attending: "Endocrinology

## 2023-10-05 ENCOUNTER — Encounter (HOSPITAL_COMMUNITY): Payer: Self-pay

## 2023-10-05 ENCOUNTER — Ambulatory Visit (HOSPITAL_COMMUNITY)
Admission: RE | Admit: 2023-10-05 | Discharge: 2023-10-05 | Disposition: A | Discharge status: Home / Self Care | Payer: Medicare Other | Source: Ambulatory Visit | Attending: "Endocrinology | Admitting: "Endocrinology

## 2023-12-25 DIAGNOSIS — M150 Primary generalized (osteo)arthritis: Secondary | ICD-10-CM | POA: Diagnosis not present

## 2023-12-25 DIAGNOSIS — E559 Vitamin D deficiency, unspecified: Secondary | ICD-10-CM | POA: Diagnosis not present

## 2023-12-25 DIAGNOSIS — I10 Essential (primary) hypertension: Secondary | ICD-10-CM | POA: Diagnosis not present

## 2023-12-25 DIAGNOSIS — M170 Bilateral primary osteoarthritis of knee: Secondary | ICD-10-CM | POA: Diagnosis not present

## 2023-12-25 DIAGNOSIS — D649 Anemia, unspecified: Secondary | ICD-10-CM | POA: Diagnosis not present

## 2023-12-25 DIAGNOSIS — Z0000 Encounter for general adult medical examination without abnormal findings: Secondary | ICD-10-CM | POA: Diagnosis not present

## 2023-12-25 DIAGNOSIS — I480 Paroxysmal atrial fibrillation: Secondary | ICD-10-CM | POA: Diagnosis not present

## 2023-12-25 DIAGNOSIS — E7800 Pure hypercholesterolemia, unspecified: Secondary | ICD-10-CM | POA: Diagnosis not present

## 2024-02-19 DIAGNOSIS — E7800 Pure hypercholesterolemia, unspecified: Secondary | ICD-10-CM | POA: Diagnosis not present

## 2024-02-19 DIAGNOSIS — I480 Paroxysmal atrial fibrillation: Secondary | ICD-10-CM | POA: Diagnosis not present

## 2024-02-19 DIAGNOSIS — M150 Primary generalized (osteo)arthritis: Secondary | ICD-10-CM | POA: Diagnosis not present

## 2024-02-19 DIAGNOSIS — I10 Essential (primary) hypertension: Secondary | ICD-10-CM | POA: Diagnosis not present

## 2024-04-22 DIAGNOSIS — I10 Essential (primary) hypertension: Secondary | ICD-10-CM | POA: Diagnosis not present

## 2024-04-22 DIAGNOSIS — I480 Paroxysmal atrial fibrillation: Secondary | ICD-10-CM | POA: Diagnosis not present

## 2024-04-22 DIAGNOSIS — M150 Primary generalized (osteo)arthritis: Secondary | ICD-10-CM | POA: Diagnosis not present

## 2024-04-22 DIAGNOSIS — M1711 Unilateral primary osteoarthritis, right knee: Secondary | ICD-10-CM | POA: Diagnosis not present

## 2024-04-22 DIAGNOSIS — E7800 Pure hypercholesterolemia, unspecified: Secondary | ICD-10-CM | POA: Diagnosis not present

## 2024-04-22 DIAGNOSIS — S8011XA Contusion of right lower leg, initial encounter: Secondary | ICD-10-CM | POA: Diagnosis not present

## 2024-04-22 DIAGNOSIS — G4762 Sleep related leg cramps: Secondary | ICD-10-CM | POA: Diagnosis not present

## 2024-06-17 ENCOUNTER — Ambulatory Visit: Attending: Cardiology | Admitting: Cardiology

## 2024-06-17 ENCOUNTER — Encounter: Payer: Self-pay | Admitting: Cardiology

## 2024-06-17 VITALS — BP 132/70 | HR 83 | Ht 66.0 in | Wt 312.0 lb

## 2024-06-17 DIAGNOSIS — I4819 Other persistent atrial fibrillation: Secondary | ICD-10-CM

## 2024-06-17 DIAGNOSIS — I4891 Unspecified atrial fibrillation: Secondary | ICD-10-CM

## 2024-06-17 DIAGNOSIS — I350 Nonrheumatic aortic (valve) stenosis: Secondary | ICD-10-CM | POA: Diagnosis not present

## 2024-06-17 NOTE — Progress Notes (Signed)
    Cardiology Office Note  Date: 06/17/2024   ID: Melissa Garner, DOB Aug 27, 1956, MRN [id number]  History of Present Illness: Melissa Garner is a 68 y.o. female last seen in the office in December 2022, now presenting overdue for follow-up.  She does not report any palpitations or unusual shortness of breath with typical activity, no exertional chest pain.  She has been having trouble with arthritic right knee pain, plans to see an orthopedic specialist regarding potential need for joint replacement.  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  We went over her medications.  She does not report any spontaneous bleeding problems on Eliquis .  Continues to follow-up with Dr. Leigh for primary care.  I reviewed her ECG today which shows rate controlled atrial fibrillation with nonspecific T wave changes.  I went over her most recent lab work.  Her last echocardiogram was in December 2022.  Physical Exam: VS:  BP 132/70 (BP Location: Left Arm, Cuff Size: Large)   Pulse 83   Ht 5' 6 (1.676 m)   Wt (!) 312 lb (141.5 kg)   SpO2 97%   BMI 50.36 kg/m , BMI Body mass index is 50.36 kg/m.  Wt Readings from Last 3 Encounters:  06/17/24 (!) 312 lb (141.5 kg)  09/21/23 (!) 309 lb (140.2 kg)  06/06/23 300 lb (136.1 kg)    General: Patient appears comfortable at rest. HEENT: Conjunctiva and lids normal. Neck: Supple, no elevated JVP or carotid bruits. Lungs: Clear to auscultation, nonlabored breathing at rest. Cardiac: Irregular irregular, 2/6 systolic murmur, no gallop.  ECG:  An ECG dated 06/06/2023 was personally reviewed today and demonstrated:  Atrial fibrillation with nonspecific ST-T changes.  Labwork: 09/10/2023: ALT 15; AST 24; BUN 10; Creatinine, Ser 0.81; Hemoglobin 12.1; Platelets 214; Potassium 3.9; Sodium 135 09/19/2023: TSH 1.170   Other Studies Reviewed Today:  No interval cardiac testing for review today.  Assessment and Plan:  1.  Persistent atrial  fibrillation with CHA2DS2-VASc score of 4.  Asymptomatic and with adequate heart rate control in the absence of AV nodal blockers.  She is on Eliquis  5 mg twice daily for stroke prophylaxis, reports no spontaneous bleeding problems.  Follows lab work with PCP.  ECG reviewed and stable.  2.  Aortic stenosis, mild with mean AV gradient 7 mmHg and dimensionless index 0.52 at echocardiogram and December 2022.  Plan to update echocardiogram in 6 months with clinical visit.  Disposition:  Follow up 6 months.  Signed, Jayson JUDITHANN Sierras, M.D., F.A.C.C. [facility] at [facility]

## 2024-06-17 NOTE — Patient Instructions (Signed)
 Medication Instructions:   Your physician recommends that you continue on your current medications as directed. Please refer to the Current Medication list given to you today.   Labwork:  None today  Testing/Procedures: Your physician has requested that you have an echocardiogram IN 6 MONTHS. Echocardiography is a painless test that uses sound waves to create images of your heart. It provides your doctor with information about the size and shape of your heart and how well your heart's chambers and valves are working. This procedure takes approximately one hour. There are no restrictions for this procedure. Please do NOT wear cologne, perfume, aftershave, or lotions (deodorant is allowed). Please arrive 15 minutes prior to your appointment time.  Please note: We ask at that you not bring children with you during ultrasound (echo/ vascular) testing. Due to room size and safety concerns, children are not allowed in the ultrasound rooms during exams. Our front office staff cannot provide observation of children in our lobby area while testing is being conducted. An adult accompanying a patient to their appointment will only be allowed in the ultrasound room at the discretion of the ultrasound technician under special circumstances. We apologize for any inconvenience.   Follow-Up: In 6 months after Echo  Any Other Special Instructions Will Be Listed Below (If Applicable).  If you need a refill on your cardiac medications before your next appointment, please call your pharmacy.

## 2024-08-05 DIAGNOSIS — E7800 Pure hypercholesterolemia, unspecified: Secondary | ICD-10-CM | POA: Diagnosis not present

## 2024-08-05 DIAGNOSIS — M1711 Unilateral primary osteoarthritis, right knee: Secondary | ICD-10-CM | POA: Diagnosis not present

## 2024-08-05 DIAGNOSIS — Z23 Encounter for immunization: Secondary | ICD-10-CM | POA: Diagnosis not present

## 2024-08-05 DIAGNOSIS — I480 Paroxysmal atrial fibrillation: Secondary | ICD-10-CM | POA: Diagnosis not present

## 2024-08-05 DIAGNOSIS — I10 Essential (primary) hypertension: Secondary | ICD-10-CM | POA: Diagnosis not present

## 2024-08-29 DIAGNOSIS — M25775 Osteophyte, left foot: Secondary | ICD-10-CM | POA: Diagnosis not present

## 2024-08-29 DIAGNOSIS — M79672 Pain in left foot: Secondary | ICD-10-CM | POA: Diagnosis not present

## 2024-08-29 DIAGNOSIS — M7752 Other enthesopathy of left foot: Secondary | ICD-10-CM | POA: Diagnosis not present

## 2024-09-10 ENCOUNTER — Telehealth: Payer: Self-pay | Admitting: "Endocrinology

## 2024-09-10 NOTE — Telephone Encounter (Signed)
 Please update labs

## 2024-09-11 ENCOUNTER — Other Ambulatory Visit: Payer: Self-pay | Admitting: *Deleted

## 2024-09-11 DIAGNOSIS — E559 Vitamin D deficiency, unspecified: Secondary | ICD-10-CM

## 2024-09-11 DIAGNOSIS — E042 Nontoxic multinodular goiter: Secondary | ICD-10-CM

## 2024-09-11 DIAGNOSIS — E210 Primary hyperparathyroidism: Secondary | ICD-10-CM

## 2024-09-11 NOTE — Telephone Encounter (Signed)
 Lab updated.

## 2024-09-18 LAB — LIPID PANEL
Chol/HDL Ratio: 3.6 ratio (ref 0.0–4.4)
Cholesterol, Total: 137 mg/dL (ref 100–199)
HDL: 38 mg/dL — ABNORMAL LOW (ref >39)
LDL Chol Calc (NIH): 84 mg/dL (ref 0–99)
Triglycerides: 75 mg/dL (ref 0–149)
VLDL Cholesterol Cal: 15 mg/dL (ref 5–40)

## 2024-09-18 LAB — T4, FREE: Free T4: 1.13 ng/dL (ref 0.82–1.77)

## 2024-09-18 LAB — PTH, INTACT AND CALCIUM
Calcium: 9.8 mg/dL (ref 8.7–10.3)
PTH: 35 pg/mL (ref 15–65)

## 2024-09-18 LAB — TSH: TSH: 1.47 u[IU]/mL (ref 0.450–4.500)

## 2024-09-19 ENCOUNTER — Ambulatory Visit (HOSPITAL_COMMUNITY)
Admission: RE | Admit: 2024-09-19 | Discharge: 2024-09-19 | Disposition: A | Discharge status: Home / Self Care | Payer: Medicare Other | Source: Ambulatory Visit | Attending: "Endocrinology | Admitting: "Endocrinology

## 2024-09-19 DIAGNOSIS — E042 Nontoxic multinodular goiter: Secondary | ICD-10-CM | POA: Diagnosis present

## 2024-09-20 ENCOUNTER — Encounter: Payer: Self-pay | Admitting: "Endocrinology

## 2024-09-20 ENCOUNTER — Ambulatory Visit: Payer: Self-pay

## 2024-09-20 ENCOUNTER — Other Ambulatory Visit: Payer: Self-pay | Admitting: "Endocrinology

## 2024-09-20 ENCOUNTER — Ambulatory Visit: Payer: Medicare Other | Admitting: "Endocrinology

## 2024-09-20 VITALS — BP 142/80 | HR 64 | Ht 66.0 in | Wt 307.8 lb

## 2024-09-20 DIAGNOSIS — E042 Nontoxic multinodular goiter: Secondary | ICD-10-CM

## 2024-09-20 DIAGNOSIS — E559 Vitamin D deficiency, unspecified: Secondary | ICD-10-CM | POA: Diagnosis not present

## 2024-09-20 NOTE — Telephone Encounter (Signed)
Left a message requesting a return call the office.

## 2024-09-20 NOTE — Progress Notes (Signed)
 09/20/2024    Endocrinology follow-up note      Melissa Garner is a 68 y.o.-year-old female, referred by her  PCP  Leigh Lung, MD , for evaluation for hypercalcemia/hyperparathyroidism.  She is status post parathyroid  surgery with resolution of hypercalcemia.  She also is known to have multinodular goiter. She is returning for follow-up with repeat labs.  Past Medical History:  Diagnosis Date   Aortic stenosis    COPD (chronic obstructive pulmonary disease) (HCC)    Essential hypertension    Hyperlipidemia    Lumbar disc disease    Left leg pain   Obesity    Osteoarthritis    Parathyroid  abnormality    Paroxysmal atrial fibrillation (HCC)    Upper respiratory infection     Past Surgical History:  Procedure Laterality Date   ABDOMINAL HYSTERECTOMY  1989   Partial   CARPAL TUNNEL RELEASE Bilateral 2005   COLONOSCOPY N/A 01/14/2016   Procedure: COLONOSCOPY;  Surgeon: Margo LITTIE Haddock, MD;  Location: [facility];  Service: Endoscopy;  Laterality: N/A;  230    KNEE SURGERY Left 2008   total   PARATHYROIDECTOMY Left 12/02/2021   Procedure: LEFT PARATHYROIDECTOMY;  Surgeon: Eletha Boas, MD;  Location: [facility];  Service: General;  Laterality: Left;    Social History   Tobacco Use   Smoking status: Never   Smokeless tobacco: Never  Vaping Use   Vaping status: Never Used  Substance Use Topics   Alcohol use: No    Alcohol/week: 0.0 standard drinks of alcohol   Drug use: No    Family History  Problem Relation Age of Onset   Heart failure Mother    Alcoholism Father    Cancer Father    COPD Sister    Hypertension Sister    Arthritis Sister    Seizures Sister    Hypertension Brother    Arthritis Brother    Hypertension Brother    Arthritis Brother    Hypertension Brother    Arthritis Brother    Hypertension Sister    Hypertension Sister    COPD Sister    Hypertension Daughter    Hypertension Son    Crohn's disease Son    Colon cancer Neg Hx      Outpatient Encounter Medications as of 09/20/2024  Medication Sig   acetaminophen  (TYLENOL ) 500 MG tablet Take 1,000 mg by mouth every 8 (eight) hours as needed for moderate pain.   amLODipine  (NORVASC ) 10 MG tablet Take 1 tablet (10 mg total) by mouth daily.   apixaban  (ELIQUIS ) 5 MG TABS tablet Take 1 tablet (5 mg total) by mouth 2 (two) times daily.   atorvastatin  (LIPITOR) 20 MG tablet Take 1 tablet (20 mg total) by mouth daily.   Budeson-Glycopyrrol-Formoterol (BREZTRI AEROSPHERE) 160-9-4.8 MCG/ACT AERO Inhale 2 puffs into the lungs in the morning and at bedtime.   Cholecalciferol  50 MCG (2000 UT) CAPS Take 1 capsule (2,000 Units total) by mouth daily with breakfast.   clobetasol cream (TEMOVATE) 0.05 % Apply 1 application topically daily as needed (breakouts).   diclofenac sodium (VOLTAREN) 1 % GEL Apply 1 application topically daily as needed (pain).   lisinopril -hydrochlorothiazide  (ZESTORETIC ) 20-12.5 MG tablet Take 1 tablet by mouth daily.   naproxen  (NAPROSYN ) 500 MG tablet Take 1 tablet (500 mg total) by mouth 2 (two) times daily with a meal. (Patient not taking: Reported on 06/17/2024)   ondansetron  (ZOFRAN -ODT) 4 MG disintegrating tablet Take 1 tablet (4 mg total) by mouth every 8 (eight)  hours as needed for nausea or vomiting. (Patient not taking: Reported on 06/17/2024)   polyethylene glycol (MIRALAX  / GLYCOLAX ) 17 g packet Take 17 g by mouth daily.   potassium chloride  (KLOR-CON ) 10 MEQ tablet Take 1 tablet (10 mEq total) by mouth daily. Take While taking hydrochlorothiazide /HCTZ   senna-docusate (SENOKOT-S) 8.6-50 MG tablet Take 2 tablets by mouth at bedtime. (Patient not taking: Reported on 06/17/2024)   No facility-administered encounter medications on file as of 09/20/2024.    No Known Allergies   HPI  Melissa Garner is returning for follow-up after she was seen in consultation for hypercalcemia associated with elevated PTH level.  Her presentation was consistent  with primary hyperparathyroidism. After appropriate work confirming hypercalcemia as a result of PHPT, she was sent for surgery, underwent surgery by Dr. Krystal Spinner on 12/03/21. Surgical out come: Left superior adenoma. She developed subsequent exertional hypercalcemia.  Her previsit labs show calcium  9.8 and PTH at 35.     She is status post treatment for vitamin D  deficiency currently replete at 42.7. Patient has no previously known  pituitary, adrenal dysfunctions; no family history of such dysfunctions. -She has normal renal function.  She did not have any recent bone density.  No prior history of fragility fractures or falls. No history of  kidney stones.  No history of CKD.   she is not on HCTZ or other thiazide therapy. she is not on calcium  supplements,  she eats dairy and green, leafy, vegetables on average amounts.  she does not have a family history of hypercalcemia, pituitary tumors, thyroid  cancer, or osteoporosis.  Her bone density in October 2021 was normal.  During recent trip to ER she underwent CT scan of head and neck which revealed multiple thyroid  nodules.  She underwent thyroid  ultrasound which confirms multiple nodules including 2.5 cm suspicious nodule in the left lobe.  October 2020 biopsy of the suspicious nodule prior to her recent visit was benign.   She continues to have euthyroid presentation.  Her previsit thyroid  surveillance ultrasound is not reported.   ROS: Limited as above.  PE: BP (!) 142/80 Comment: pt states she has not taken her medication today.  Pulse 64   Ht 5' 6 (1.676 m)   Wt (!) 307 lb 12.8 oz (139.6 kg)   BMI 49.68 kg/m , Body mass index is 49.68 kg/m. Wt Readings from Last 3 Encounters:  09/20/24 (!) 307 lb 12.8 oz (139.6 kg)  06/17/24 (!) 312 lb (141.5 kg)  09/21/23 (!) 309 lb (140.2 kg)     CMP ( most recent) CMP     Component Value Date/Time   NA 135 09/10/2023 1545   K 3.9 09/10/2023 1545   CL 97 (L) 09/10/2023 1545    CO2 27 09/10/2023 1545   GLUCOSE 126 (H) 09/10/2023 1545   BUN 10 09/10/2023 1545   CREATININE 0.81 09/10/2023 1545   CALCIUM  9.8 09/16/2024 1045   PROT 8.0 09/10/2023 1545   ALBUMIN 3.7 09/10/2023 1545   AST 24 09/10/2023 1545   ALT 15 09/10/2023 1545   ALKPHOS 77 09/10/2023 1545   BILITOT 1.2 (H) 09/10/2023 1545   GFRNONAA >60 09/10/2023 1545   GFRAA >60 07/04/2019 0905     Lab Results  Component Value Date   TSH 1.470 09/16/2024   TSH 1.170 09/19/2023   TSH 1.409 06/07/2023   TSH 1.620 08/23/2022   TSH 2.760 02/16/2022   TSH 1.250 06/16/2021   TSH 1.730 02/15/2021   TSH 1.45 08/05/2020  TSH 1.38 05/20/2019   TSH 1.716 07/11/2013   FREET4 1.13 09/16/2024   FREET4 1.06 09/19/2023   FREET4 1.13 08/23/2022   FREET4 1.02 02/16/2022   FREET4 1.04 06/16/2021   FREET4 1.00 02/15/2021   FREET4 1.0 08/05/2020   FREET4 0.94 07/11/2013    May 20, 2019 PTH 119, calcium  10.7  Assessment: 1. Hypercalcemia / Hyperparathyroidism- resolved after surgery 2.  Multinodular goiter-benign FNA October 2020 3.  Vitamin D  deficiency  Plan: I discussed her recent labs, previous imaging studies and surgical outcomes.  She returns with controlled calcium  at 9.8 and PTH at 35.  I  - Left superior parathyroid  adenoma was removed during her previous surgery. She will not need further treatment at this time.    Her bone density is normal.  She will be kept on close follow-up with repeat labs in 12 months. - No apparent complications from hypercalcemia/hyperparathyroidism: no history of  nephrolithiasis,  osteoporosis, fragility fractures. No abdominal pain, no major mood disorders, no bone pain.   She will be continued on vitamin D3 2000 units daily.     She is advised to avoid any over-the-counter calcium  supplements.   Regarding her multinodular goiter, she is status post biopsy of left lobe nodule with benign outcomes in October 2020.   She underwent surveillance thyroid  ultrasound  on August 19, 2024.  Patient really was not reported yet.  She will be contacted if significant findings are noted.   She is advised to call clinic for compressive symptoms.  If findings are unremarkable, she will return in a year for another physical exam and previsit labs. If she develops compressive symptoms, she will be considered for thyroidectomy.  She is advised to maintain close follow-up with her PCP.   I spent  22  minutes in the care of the patient today including review of labs from Thyroid  Function, CMP, and other relevant labs ; imaging/biopsy records (current and previous including abstractions from other facilities); face-to-face time discussing  her lab results and symptoms, medications doses, her options of short and long term treatment based on the latest standards of care / guidelines;   and documenting the encounter.  Melissa Garner  participated in the discussions, expressed understanding, and voiced agreement with the above plans.  All questions were answered to her satisfaction. she is encouraged to contact clinic should she have any questions or concerns prior to her return visit.   - Return in about 1 year (around 09/20/2025), or we will call her with the ulterasound results, for F/U with Pre-visit Labs.   Melissa Earl, MD [facility] [address] [place], [place] [address] Phone: [phone]  Fax: [phone]    This note was partially dictated with voice recognition software. Similar sounding words can be transcribed inadequately or may not  be corrected upon review.  09/20/2024, 10:27 AM

## 2024-09-20 NOTE — Telephone Encounter (Signed)
-----   Message from Melissa Garner sent at 09/20/2024 10:47 AM EST ----- Melissa Garner, They just posted her ultrasound results. This patient will need a FNA of a new Left lobe Nodule.  ----- Message ----- From: Interface, Rad Results In Sent: 09/20/2024  10:42 AM EST To: Ethelle LELON Earl, MD

## 2024-09-23 ENCOUNTER — Ambulatory Visit (INDEPENDENT_AMBULATORY_CARE_PROVIDER_SITE_OTHER): Admitting: Podiatry

## 2024-09-23 DIAGNOSIS — Z91199 Patient's noncompliance with other medical treatment and regimen due to unspecified reason: Secondary | ICD-10-CM

## 2024-09-23 NOTE — Telephone Encounter (Signed)
 Pt made aware. Stated she is scheduled for FNA this coming Friday.

## 2024-09-23 NOTE — Progress Notes (Signed)
 No show

## 2024-09-23 NOTE — Telephone Encounter (Signed)
-----   Message from Ranny Earl sent at 09/20/2024 10:47 AM EST ----- Zada, They just posted her ultrasound results. This patient will need a FNA of a new Left lobe Nodule.  ----- Message ----- From: Interface, Rad Results In Sent: 09/20/2024  10:42 AM EST To: Ethelle LELON Earl, MD

## 2024-09-27 ENCOUNTER — Ambulatory Visit (HOSPITAL_COMMUNITY)
Admission: RE | Admit: 2024-09-27 | Discharge: 2024-09-27 | Disposition: A | Discharge status: Home / Self Care | Source: Ambulatory Visit | Attending: "Endocrinology | Admitting: "Endocrinology

## 2024-09-27 ENCOUNTER — Encounter (HOSPITAL_COMMUNITY): Payer: Self-pay

## 2024-09-27 DIAGNOSIS — E042 Nontoxic multinodular goiter: Secondary | ICD-10-CM | POA: Diagnosis present

## 2024-09-27 DIAGNOSIS — E041 Nontoxic single thyroid nodule: Secondary | ICD-10-CM | POA: Insufficient documentation

## 2024-09-27 MED ORDER — LIDOCAINE HCL (PF) 2 % IJ SOLN
10.0000 mL | Freq: Once | INTRAMUSCULAR | Status: AC
  Administered 2024-09-27: 10 mL

## 2024-09-27 MED ORDER — LIDOCAINE HCL (PF) 2 % IJ SOLN
INTRAMUSCULAR | Status: AC
Start: 2024-09-27 — End: 2024-09-27
  Filled 2024-09-27: qty 10

## 2024-09-27 NOTE — Progress Notes (Signed)
PT tolerated thyroid biopsy procedure well today. Labs and afirma obtained and sent for pathology by Lexi from ultrasound. PT ambulatory at discharge with no acute distress noted and verbalized understanding of discharge instructions.

## 2024-10-04 LAB — CYTOLOGY - NON PAP

## 2024-10-09 ENCOUNTER — Telehealth: Payer: Self-pay

## 2024-10-09 NOTE — Telephone Encounter (Signed)
Pt called requesting results of thyroid biopsy.

## 2024-10-28 NOTE — Telephone Encounter (Signed)
 Pt still waiting on results. Please Advise

## 2024-10-28 NOTE — Telephone Encounter (Signed)
 Spoke with patient advising on waiting for Afirma test results and will notify patient once Dr. Nida receives them. Patient did state she has been having spasm in her throat and wanted to know what could be done.

## 2024-11-05 ENCOUNTER — Other Ambulatory Visit: Payer: Self-pay | Admitting: "Endocrinology

## 2024-11-05 DIAGNOSIS — E042 Nontoxic multinodular goiter: Secondary | ICD-10-CM

## 2024-11-05 NOTE — Telephone Encounter (Signed)
 I do not see any Afirma results as of yet.

## 2024-11-05 NOTE — Telephone Encounter (Signed)
 Discussed with pt the need to repeat the FNA Biopsy of her thyroid  due to insufficient amount of sample. Advised her she would be contacted to schedule at her convenience. Pt voiced understanding.

## 2024-11-08 ENCOUNTER — Ambulatory Visit (HOSPITAL_COMMUNITY): Admission: RE | Admit: 2024-11-08 | Source: Ambulatory Visit

## 2024-11-15 ENCOUNTER — Encounter (HOSPITAL_COMMUNITY): Payer: Self-pay

## 2024-11-15 ENCOUNTER — Ambulatory Visit (HOSPITAL_COMMUNITY)
Admission: RE | Admit: 2024-11-15 | Discharge: 2024-11-15 | Disposition: A | Discharge status: Home / Self Care | Source: Ambulatory Visit | Attending: "Endocrinology | Admitting: "Endocrinology

## 2024-11-15 DIAGNOSIS — E042 Nontoxic multinodular goiter: Secondary | ICD-10-CM | POA: Diagnosis present

## 2024-11-15 MED ORDER — LIDOCAINE HCL (PF) 2 % IJ SOLN
INTRAMUSCULAR | Status: AC
  Filled 2024-11-15: qty 10

## 2024-11-15 NOTE — Progress Notes (Signed)
 Patient brought to US  1  in no acute distress. Thyroid  biopsy explained, consent obtained. Prepped and draped with sterile technique. US  imaging obtained. Local anesthetic admin without complication. SAmples obtained and collected per protocol. Needle/syringe withdrawn. Site bandaged. DC instructions provided. Escorted to radiology waiting for DC in no acute distress.

## 2024-11-18 LAB — CYTOLOGY - NON PAP

## 2024-11-27 NOTE — Telephone Encounter (Signed)
Pt coming tomorrow

## 2024-11-27 NOTE — Telephone Encounter (Signed)
Patient is asking for her results

## 2024-11-28 ENCOUNTER — Encounter: Payer: Self-pay | Admitting: "Endocrinology

## 2024-11-28 ENCOUNTER — Ambulatory Visit: Admitting: "Endocrinology

## 2024-11-28 VITALS — BP 120/64 | HR 68 | Ht 66.0 in | Wt 310.0 lb

## 2024-11-28 DIAGNOSIS — E559 Vitamin D deficiency, unspecified: Secondary | ICD-10-CM | POA: Diagnosis not present

## 2024-11-28 DIAGNOSIS — E042 Nontoxic multinodular goiter: Secondary | ICD-10-CM | POA: Diagnosis not present

## 2024-11-28 DIAGNOSIS — E210 Primary hyperparathyroidism: Secondary | ICD-10-CM

## 2024-11-28 NOTE — Progress Notes (Signed)
 "  11/28/2024    Endocrinology follow-up note      Melissa Garner is a 69 y.o.-year-old female, referred by her  PCP  Leigh Lung, MD , for evaluation for hypercalcemia/hyperparathyroidism.  She is status post parathyroid  surgery with resolution of hypercalcemia.  She also is known to have multinodular goiter, status post 2 unsuccessful attempts for fine-needle aspiration biopsy. She is returning to discuss her options.    Past Medical History:  Diagnosis Date   Aortic stenosis    COPD (chronic obstructive pulmonary disease) (HCC)    Essential hypertension    Hyperlipidemia    Lumbar disc disease    Left leg pain   Obesity    Osteoarthritis    Parathyroid  abnormality    Paroxysmal atrial fibrillation (HCC)    Upper respiratory infection     Past Surgical History:  Procedure Laterality Date   ABDOMINAL HYSTERECTOMY  1989   Partial   CARPAL TUNNEL RELEASE Bilateral 2005   COLONOSCOPY N/A 01/14/2016   Procedure: COLONOSCOPY;  Surgeon: Margo LITTIE Haddock, MD;  Location: [facility];  Service: Endoscopy;  Laterality: N/A;  230    KNEE SURGERY Left 2008   total   PARATHYROIDECTOMY Left 12/02/2021   Procedure: LEFT PARATHYROIDECTOMY;  Surgeon: Eletha Boas, MD;  Location: [facility];  Service: General;  Laterality: Left;    Social History   Tobacco Use   Smoking status: Never   Smokeless tobacco: Never  Vaping Use   Vaping status: Never Used  Substance Use Topics   Alcohol use: No    Alcohol/week: 0.0 standard drinks of alcohol   Drug use: No    Family History  Problem Relation Age of Onset   Heart failure Mother    Alcoholism Father    Cancer Father    COPD Sister    Hypertension Sister    Arthritis Sister    Seizures Sister    Hypertension Brother    Arthritis Brother    Hypertension Brother    Arthritis Brother    Hypertension Brother    Arthritis Brother    Hypertension Sister    Hypertension Sister    COPD Sister    Hypertension Daughter    Hypertension  Son    Crohn's disease Son    Colon cancer Neg Hx     Outpatient Encounter Medications as of 11/28/2024  Medication Sig   acetaminophen  (TYLENOL ) 500 MG tablet Take 1,000 mg by mouth every 8 (eight) hours as needed for moderate pain.   amLODipine  (NORVASC ) 10 MG tablet Take 1 tablet (10 mg total) by mouth daily.   apixaban  (ELIQUIS ) 5 MG TABS tablet Take 1 tablet (5 mg total) by mouth 2 (two) times daily.   atorvastatin  (LIPITOR) 20 MG tablet Take 1 tablet (20 mg total) by mouth daily.   Budeson-Glycopyrrol-Formoterol (BREZTRI AEROSPHERE) 160-9-4.8 MCG/ACT AERO Inhale 2 puffs into the lungs in the morning and at bedtime.   Cholecalciferol  50 MCG (2000 UT) CAPS Take 1 capsule (2,000 Units total) by mouth daily with breakfast.   clobetasol cream (TEMOVATE) 0.05 % Apply 1 application topically daily as needed (breakouts).   diclofenac sodium (VOLTAREN) 1 % GEL Apply 1 application topically daily as needed (pain).   lisinopril -hydrochlorothiazide  (ZESTORETIC ) 20-12.5 MG tablet Take 1 tablet by mouth daily.   naproxen  (NAPROSYN ) 500 MG tablet Take 1 tablet (500 mg total) by mouth 2 (two) times daily with a meal. (Patient not taking: Reported on 06/17/2024)   ondansetron  (ZOFRAN -ODT) 4 MG disintegrating tablet  Take 1 tablet (4 mg total) by mouth every 8 (eight) hours as needed for nausea or vomiting. (Patient not taking: Reported on 06/17/2024)   polyethylene glycol (MIRALAX  / GLYCOLAX ) 17 g packet Take 17 g by mouth daily.   potassium chloride  (KLOR-CON ) 10 MEQ tablet Take 1 tablet (10 mEq total) by mouth daily. Take While taking hydrochlorothiazide /HCTZ   senna-docusate (SENOKOT-S) 8.6-50 MG tablet Take 2 tablets by mouth at bedtime. (Patient not taking: Reported on 06/17/2024)   No facility-administered encounter medications on file as of 11/28/2024.    No Known Allergies   HPI  Melissa Garner is returning for follow-up after she was seen in consultation for hypercalcemia associated with  elevated PTH level.  Her presentation was consistent with primary hyperparathyroidism. After appropriate work confirming hypercalcemia as a result of PHPT, she was sent for surgery, underwent surgery by Dr. Krystal Spinner on 12/03/21. Surgical out come: Left superior adenoma. She has responded to surgery with resolution of her hypercalcemia.     She is on ongoing vitamin D  supplement with 2000 units daily.   Patient has no previously known  pituitary, adrenal dysfunctions; no family history of such dysfunctions. -She has normal renal function.  She did not have any recent bone density.  No prior history of fragility fractures or falls. No history of  kidney stones.  No history of CKD.   she is not on HCTZ or other thiazide therapy. she is not on calcium  supplements,  she eats dairy and green, leafy, vegetables on average amounts.  she does not have a family history of hypercalcemia, pituitary tumors, thyroid  cancer, or osteoporosis.  Her bone density in October 2021 was normal.  During recent trip to ER she underwent CT scan of head and neck which revealed multiple thyroid  nodules.  She is known to have multinodular goiter for several years.  She underwent thyroid  ultrasound which confirms multiple nodules including 2.5 cm suspicious nodule in the left lobe.   I attempted to take a sample with FNA in November 2025 was not successful.  Repeat attempt to biopsy this nodule in January 2026 was also not successful to produce diagnostic material. October 2020 biopsy of the suspicious nodule  was benign.  She presents with some vague neck discomfort including occasional difficulty swallowing. She continues to have euthyroid presentation.    ROS: Limited as above.  PE: BP 120/64   Pulse 68   Ht 5' 6 (1.676 m)   Wt (!) 310 lb (140.6 kg)   BMI 50.04 kg/m , Body mass index is 50.04 kg/m. Wt Readings from Last 3 Encounters:  11/28/24 (!) 310 lb (140.6 kg)  09/20/24 (!) 307 lb 12.8 oz (139.6 kg)   06/17/24 (!) 312 lb (141.5 kg)     CMP ( most recent) CMP     Component Value Date/Time   NA 135 09/10/2023 1545   K 3.9 09/10/2023 1545   CL 97 (L) 09/10/2023 1545   CO2 27 09/10/2023 1545   GLUCOSE 126 (H) 09/10/2023 1545   BUN 10 09/10/2023 1545   CREATININE 0.81 09/10/2023 1545   CALCIUM  9.8 09/16/2024 1045   PROT 8.0 09/10/2023 1545   ALBUMIN 3.7 09/10/2023 1545   AST 24 09/10/2023 1545   ALT 15 09/10/2023 1545   ALKPHOS 77 09/10/2023 1545   BILITOT 1.2 (H) 09/10/2023 1545   GFRNONAA >60 09/10/2023 1545   GFRAA >60 07/04/2019 0905     Lab Results  Component Value Date   TSH 1.470 09/16/2024  TSH 1.170 09/19/2023   TSH 1.409 06/07/2023   TSH 1.620 08/23/2022   TSH 2.760 02/16/2022   TSH 1.250 06/16/2021   TSH 1.730 02/15/2021   TSH 1.45 08/05/2020   TSH 1.38 05/20/2019   TSH 1.716 07/11/2013   FREET4 1.13 09/16/2024   FREET4 1.06 09/19/2023   FREET4 1.13 08/23/2022   FREET4 1.02 02/16/2022   FREET4 1.04 06/16/2021   FREET4 1.00 02/15/2021   FREET4 1.0 08/05/2020   FREET4 0.94 07/11/2013    May 20, 2019 PTH 119, calcium  10.7   Thyroid  ultrasound September 27, 2024   EXAM: US  THYROID  09/19/2024 02:47:59 PM   TECHNIQUE: Real-time ultrasound scan of the thyroid  gland and soft tissues of the neck with image documentation.   COMPARISON: None available.   CLINICAL HISTORY: Surveillance for MNG. Previous FNA biopsy inferior left nodule 08/14/2019.   FINDINGS:   Right thyroid  lobe:  cm Left thyroid  lobe: 5.4 x 3.1 x 3.9 cm, previously 6.5 x 3.1 x 3.9 cm. Isthmus:  cm   Echotexture: Parenchymal echotexture moderately heterogenous.   Vascularity: Normal Vascularity.   Thyroid  Nodules: 8 nodules greater than 1 cm.   Nodule 1 left isthmus: 1.1 x 1 x 0.8 cm, previously 1 x 0.8 x 0.8 cm. Mostly solid, isoechoic, indistinct margins, no calcifications, wider than tall. TIRADS 3.   Nodule 2 superior right: 1.1 x 0.9 x 1.1 cm, previously 1  x 0.9 x 0.9 cm. Mostly solid, hyperechoic, indistinct margins, wider than tall, no calcifications. TIRADS 3.   Nodule 3 medial right: 1.4 x 1 x 1 cm, previously 1.4 x 1.3 x 0.9 cm. Complex cystic, does not meet criteria for follow up. TIRADS 2.   Nodule 4 mid right: 2 x 1.5 x 1.6 cm, previously 1.1 x 1.7 x 1.4 cm. Solid, hypoechoic, wider than tall, no calcifications, indistinct margins. TIRADS 4.   Nodule 5 inferior right: 1.2 x 0.8 x 0.8 cm, previously 1.4 x 1 x 0.9 cm. Solid, isoechoic, taller than wide, no calcifications, indistinct margins. TIRADS 4.   Nodule 6 superior left: 2 x 1.6 x 1.4 cm, previously 2 x 1.7 x 1.5 cm. Complex cystic, does not meet criteria for follow up. TIRADS 2.   Nodule 8 inferior left: 3.5 x 2.8 x 3.5 cm, previously 3.5 x 3.4 x 4.2 cm. Interval cystic degeneration. This was previously biopsied. TIRADS 2.   Nodule 9 inferior left: 2.7 x 1.9 x 1.3 cm, previously 2.1 x 1.8 x 1.5 cm. Solid, isoechoic, wider than tall, indistinct margins, no calcifications. TIRADS 3.   Soft tissues: No visualized lymphadenopathy.   IMPRESSION: 1. Inferior left thyroid  nodule 9 measuring 2.7cm , TI-RADS TR3. Fine-needle aspiration recommended given size 2.5 cm. 2. No suspicious cervical lymphadenopathy.   Electronically signed by: Dayne Hassell MD 09/20/2024 09:28 AM EST RP Workstation: [id number]  First attempt fine-needle aspiration on September 27, 2024 FINAL MICROSCOPIC DIAGNOSIS:  - Nondiagnostic material  - Scant follicular epithelium present (Bethesda category I)   SPECIMEN ADEQUACY:  INSUFFICIENT FOR DIAGNOSIS. The specimen is processed and examined  microscopically, but found to be UNSATISFACTORY for evaluation.   GROSS:  Received is/are 30 ccs of pink Cytolyt solution. (NT:nt)  Prepared:  Smears: 0  Concentration Method (Thin Prep):  1  Cell Block:  Cell block attempted, not obtained.    Second attempt fine-needle aspiration on November 15, 2024 FINAL MICROSCOPIC DIAGNOSIS:  - Scant follicular epithelium present (Bethesda category I)   SPECIMEN ADEQUACY:  Satisfactory but limited  for evaluation, scant cellularity   GROSS:  Received is/are 30 ccs of pink Cytolyt solution. (NT:nt)  Prepared:  Smears: 0  Concentration Method (Thin Prep):  1  Cell Block:  Cell block attempted, not obtained.   Assessment: 1. Hypercalcemia / Hyperparathyroidism- resolved after surgery 2.  Multinodular goiter-benign FNA October 2020, nondiagnostic fine-needle aspiration x 2 between November 2025 and January 2026 3.  Vitamin D  deficiency  Plan: I discussed her recent labs, previous imaging studies and surgical outcomes.   Considering bethesda category II  FNA findings carrying 5-10% risk of malignancy, and the patient's presentation with compressive symptoms, options were discussed with the patient including diagnostic thyroidectomy versus expectant management. She opted into diagnostic thyroidectomy which is reasonable.  I will send her to Dr. Eletha  for parathyroidectomy given multiple bilateral nodules. I explained to the patient the likely role of for surgical hypothyroidism which will require thyroid  hormone replacement for life which she understands.    I also discussed with the patient that if surgical sample shows malignancy, she may be considered for additional treatment options.  Regarding her history of hyperparathyroidism/hypercalcemia, recent labs are favorable with calcium  at 9.8 and PTH at 35.    - Left superior parathyroid  adenoma was removed during her previous surgery. She will not need further treatment at this time.    Her bone density is normal.   - No apparent complications from hypercalcemia/hyperparathyroidism: no history of  nephrolithiasis,  osteoporosis, fragility fractures. No abdominal pain, no major mood disorders, no bone pain.   She will be continued on vitamin D3 2000 units daily.     She is advised to  avoid any over-the-counter calcium  supplements.   She is advised to maintain close follow-up with her PCP.   I spent  20  minutes in the care of the patient today including review of labs from Thyroid  Function, CMP, and other relevant labs ; imaging/biopsy records (current and previous including abstractions from other facilities); face-to-face time discussing  her lab results and symptoms, medications doses, her options of short and long term treatment based on the latest standards of care / guidelines;   and documenting the encounter.  Chace Bisch  participated in the discussions, expressed understanding, and voiced agreement with the above plans.  All questions were answered to her satisfaction. she is encouraged to contact clinic should she have any questions or concerns prior to her return visit. Dear Patient: Feel free to review your progress notes.  If you are reviewing this progress note and have questions about the meaning of /or medical terms being used, please make a note and address it at your next follow-up appointment.  Medical notes are meant to be a communication tool between medical professionals and require medical terms to be used for efficiency and insurance approval.    - Return in about 4 months (around 03/28/2025) for F/U with Labs after Surgery.   Ranny Earl, MD [facility] [address] [place], [place] [address] Phone: [phone]  Fax: [phone]    This note was partially dictated with voice recognition software. Similar sounding words can be transcribed inadequately or may not  be corrected upon review.  11/28/2024, 1:44 PM "

## 2024-12-19 ENCOUNTER — Ambulatory Visit (HOSPITAL_COMMUNITY)

## 2025-04-08 ENCOUNTER — Ambulatory Visit: Admitting: "Endocrinology

## 2025-09-19 ENCOUNTER — Ambulatory Visit: Admitting: "Endocrinology
